# Patient Record
Sex: Female | Born: 1960
Health system: Southern US, Community
[De-identification: ages and names within clinical notes are randomized; demographics above are authoritative.]

## PROBLEM LIST (undated history)

## (undated) DIAGNOSIS — I10 Essential (primary) hypertension: Secondary | ICD-10-CM

## (undated) DIAGNOSIS — M51369 Other intervertebral disc degeneration, lumbar region without mention of lumbar back pain or lower extremity pain: Secondary | ICD-10-CM

## (undated) DIAGNOSIS — E039 Hypothyroidism, unspecified: Secondary | ICD-10-CM

## (undated) DIAGNOSIS — M5136 Other intervertebral disc degeneration, lumbar region: Secondary | ICD-10-CM

## (undated) HISTORY — DX: Hypothyroidism, unspecified: E03.9

## (undated) HISTORY — PX: WISDOM TOOTH EXTRACTION: SHX21

## (undated) HISTORY — DX: Other intervertebral disc degeneration, lumbar region: M51.36

## (undated) HISTORY — DX: Other intervertebral disc degeneration, lumbar region without mention of lumbar back pain or lower extremity pain: M51.369

## (undated) HISTORY — PX: COLONOSCOPY: SHX174

## (undated) HISTORY — DX: Essential (primary) hypertension: I10

---

## 2002-11-22 ENCOUNTER — Encounter: Payer: Self-pay | Admitting: Internal Medicine

## 2002-11-22 ENCOUNTER — Ambulatory Visit (HOSPITAL_COMMUNITY): Admission: RE | Admit: 2002-11-22 | Discharge: 2002-11-22 | Payer: Self-pay | Admitting: Internal Medicine

## 2003-12-05 ENCOUNTER — Ambulatory Visit (HOSPITAL_COMMUNITY): Admission: RE | Admit: 2003-12-05 | Discharge: 2003-12-05 | Payer: Self-pay | Admitting: Internal Medicine

## 2005-11-15 ENCOUNTER — Ambulatory Visit (HOSPITAL_COMMUNITY): Admission: RE | Admit: 2005-11-15 | Discharge: 2005-11-15 | Payer: Self-pay | Admitting: Internal Medicine

## 2006-08-20 ENCOUNTER — Ambulatory Visit (HOSPITAL_COMMUNITY): Admission: RE | Admit: 2006-08-20 | Discharge: 2006-08-20 | Payer: Self-pay | Admitting: Internal Medicine

## 2007-03-11 ENCOUNTER — Other Ambulatory Visit: Admission: RE | Admit: 2007-03-11 | Discharge: 2007-03-11 | Payer: Self-pay | Admitting: Unknown Physician Specialty

## 2007-09-10 HISTORY — PX: BIOPSY THYROID: PRO38

## 2010-07-16 ENCOUNTER — Ambulatory Visit (HOSPITAL_COMMUNITY): Admission: RE | Admit: 2010-07-16 | Discharge: 2010-07-16 | Payer: Self-pay | Admitting: Internal Medicine

## 2010-09-07 ENCOUNTER — Encounter
Admission: RE | Admit: 2010-09-07 | Discharge: 2010-09-07 | Payer: Self-pay | Source: Home / Self Care | Attending: Internal Medicine | Admitting: Internal Medicine

## 2013-08-03 ENCOUNTER — Other Ambulatory Visit: Payer: Self-pay | Admitting: Orthopaedic Surgery

## 2013-08-03 DIAGNOSIS — M545 Low back pain, unspecified: Secondary | ICD-10-CM

## 2013-08-06 ENCOUNTER — Ambulatory Visit
Admission: RE | Admit: 2013-08-06 | Discharge: 2013-08-06 | Disposition: A | Discharge status: Home / Self Care | Payer: 59 | Source: Ambulatory Visit | Attending: Orthopaedic Surgery | Admitting: Orthopaedic Surgery

## 2013-08-06 DIAGNOSIS — M545 Low back pain, unspecified: Secondary | ICD-10-CM

## 2013-08-09 ENCOUNTER — Ambulatory Visit (INDEPENDENT_AMBULATORY_CARE_PROVIDER_SITE_OTHER): Payer: 59 | Admitting: Internal Medicine

## 2013-08-09 ENCOUNTER — Encounter: Payer: Self-pay | Admitting: Internal Medicine

## 2013-08-09 VITALS — BP 118/76 | HR 60 | Temp 97.7°F | Resp 18 | Ht 68.5 in | Wt 157.0 lb

## 2013-08-09 DIAGNOSIS — M545 Low back pain, unspecified: Secondary | ICD-10-CM

## 2013-08-09 DIAGNOSIS — Z111 Encounter for screening for respiratory tuberculosis: Secondary | ICD-10-CM

## 2013-08-09 DIAGNOSIS — Z1212 Encounter for screening for malignant neoplasm of rectum: Secondary | ICD-10-CM

## 2013-08-09 DIAGNOSIS — E559 Vitamin D deficiency, unspecified: Secondary | ICD-10-CM | POA: Insufficient documentation

## 2013-08-09 DIAGNOSIS — R7401 Elevation of levels of liver transaminase levels: Secondary | ICD-10-CM

## 2013-08-09 DIAGNOSIS — R030 Elevated blood-pressure reading, without diagnosis of hypertension: Secondary | ICD-10-CM

## 2013-08-09 DIAGNOSIS — I10 Essential (primary) hypertension: Secondary | ICD-10-CM

## 2013-08-09 DIAGNOSIS — E039 Hypothyroidism, unspecified: Secondary | ICD-10-CM | POA: Insufficient documentation

## 2013-08-09 DIAGNOSIS — Z0000 Encounter for general adult medical examination without abnormal findings: Secondary | ICD-10-CM

## 2013-08-09 DIAGNOSIS — Z113 Encounter for screening for infections with a predominantly sexual mode of transmission: Secondary | ICD-10-CM

## 2013-08-09 LAB — CBC WITH DIFFERENTIAL/PLATELET
Basophils Absolute: 0 10*3/uL (ref 0.0–0.1)
Basophils Relative: 1 % (ref 0–1)
Eosinophils Absolute: 0.2 10*3/uL (ref 0.0–0.7)
Eosinophils Relative: 2 % (ref 0–5)
Lymphocytes Relative: 34 % (ref 12–46)
Lymphs Abs: 2.2 10*3/uL (ref 0.7–4.0)
MCHC: 34 g/dL (ref 30.0–36.0)
MCV: 87.4 fL (ref 78.0–100.0)
Monocytes Absolute: 0.5 10*3/uL (ref 0.1–1.0)
Neutrophils Relative %: 55 % (ref 43–77)
Platelets: 292 10*3/uL (ref 150–400)
RBC: 4.28 MIL/uL (ref 3.87–5.11)
RDW: 13.9 % (ref 11.5–15.5)
WBC: 6.6 10*3/uL (ref 4.0–10.5)

## 2013-08-09 LAB — BASIC METABOLIC PANEL WITH GFR
BUN: 9 mg/dL (ref 6–23)
CO2: 28 mEq/L (ref 19–32)
Chloride: 104 mEq/L (ref 96–112)
GFR, Est Non African American: >89 mL/min
Potassium: 4.3 mEq/L (ref 3.5–5.3)
Sodium: 141 mEq/L (ref 135–145)

## 2013-08-09 LAB — HEPATIC FUNCTION PANEL
ALT: 17 U/L (ref 0–35)
Albumin: 4.7 g/dL (ref 3.5–5.2)
Indirect Bilirubin: 0.6 mg/dL (ref 0.0–0.9)
Total Bilirubin: 0.8 mg/dL (ref 0.3–1.2)
Total Protein: 7.1 g/dL (ref 6.0–8.3)

## 2013-08-09 LAB — HEMOGLOBIN A1C: Hgb A1c MFr Bld: 5.5 % (ref <5.7)

## 2013-08-09 LAB — LIPID PANEL
Cholesterol: 199 mg/dL (ref 0–200)
HDL: 62 mg/dL (ref >39)
Triglycerides: 104 mg/dL (ref <150)

## 2013-08-09 LAB — MAGNESIUM: Magnesium: 2 mg/dL (ref 1.5–2.5)

## 2013-08-09 MED ORDER — GABAPENTIN 100 MG PO CAPS: ORAL_CAPSULE | ORAL | Status: DC

## 2013-08-09 NOTE — Patient Instructions (Signed)
   Continue diet & medications same as discussed.   Further disposition pending lab results.      Hypothyroidism The thyroid is a large gland located in the lower front of your neck. The thyroid gland helps control metabolism. Metabolism is how your body handles food. It controls metabolism with the hormone thyroxine. When this gland is underactive (hypothyroid), it produces too little hormone.  CAUSES These include:   Absence or destruction of thyroid tissue.  Goiter due to iodine deficiency.  Goiter due to medications.  Congenital defects (since birth).  Problems with the pituitary. This causes a lack of TSH (thyroid stimulating hormone). This hormone tells the thyroid to turn out more hormone. SYMPTOMS  Lethargy (feeling as though you have no energy)  Cold intolerance  Weight gain (in spite of normal food intake)  Dry skin  Coarse hair  Menstrual irregularity (if severe, may lead to infertility)  Slowing of thought processes Cardiac problems are also caused by insufficient amounts of thyroid hormone. Hypothyroidism in the newborn is cretinism, and is an extreme form. It is important that this form be treated adequately and immediately or it will lead rapidly to retarded physical and mental development. DIAGNOSIS  To prove hypothyroidism, your caregiver may do blood tests and ultrasound tests. Sometimes the signs are hidden. It may be necessary for your caregiver to watch this illness with blood tests either before or after diagnosis and treatment. TREATMENT  Low levels of thyroid hormone are increased by using synthetic thyroid hormone. This is a safe, effective treatment. It usually takes about four weeks to gain the full effects of the medication. After you have the full effect of the medication, it will generally take another four weeks for problems to leave. Your caregiver may start you on low doses. If you have had heart problems the dose may be gradually increased. It  is generally not an emergency to get rapidly to normal. HOME CARE INSTRUCTIONS   Take your medications as your caregiver suggests. Let your caregiver know of any medications you are taking or start taking. Your caregiver will help you with dosage schedules.  As your condition improves, your dosage needs may increase. It will be necessary to have continuing blood tests as suggested by your caregiver.  Report all suspected medication side effects to your caregiver. SEEK MEDICAL CARE IF: Seek medical care if you develop:  Sweating.  Tremulousness (tremors).  Anxiety.  Rapid weight loss.  Heat intolerance.  Emotional swings.  Diarrhea.  Weakness. SEEK IMMEDIATE MEDICAL CARE IF:  You develop chest pain, an irregular heart beat (palpitations), or a rapid heart beat. MAKE SURE YOU:   Understand these instructions.  Will watch your condition.  Will get help right away if you are not doing well or get worse. Document Released: 08/26/2005 Document Revised: 11/18/2011 Document Reviewed: 04/15/2008 [facility] Patient Information 2014 [place], [place].

## 2013-08-09 NOTE — Progress Notes (Signed)
Patient ID: Jodi Graham, female   DOB: November 06, 1960, 52 y.o.   MRN: [id number]   Annual Screening Comprehensive Examination   This very nice    presents for complete physical.  Patient has no major health issues. She does have hx/o slightly elevated BP's in 2009 and 2011 and has been followed expectantly . Today's BP is 118/76 and she denies any hypertensive or cardiac type symptoms.   Patient has hypothyroidism predating from 2009 and has been on replacement therapy.    Also, patient has LBP about 1 year and 4 days ago had Lumbar MRI per Dr Yisroel Ramming who told her she had DDD by plain xrays. She states the pain is worst with lying/sitting and least with standing. She has had no sciatica. Pain severity ranges 2-3/10 up to 7/10 at it's worst. She denies sciatica.    Lastly, patient has history of Vitamin D Deficiency with last vitamin D 44 in Nov 2013.She also has high cholesterol 254 and LDL 160 and elevated A1c 5.7% at her last OV in Nov 2013.      Meds: L-thyroxine 50 mcg tab qd, Vit 1000 u qd   Allergies  Allergen Reactions  . Codeine     Past Medical History  Diagnosis Date  . Hypertension, labile    Vitamin D Deficiency    Cervical DDD    Lumbar DDD   . Hypothyroidism    PSHx: 2008 Thyroid Bx Lymphocytic Thyroiditis                      TAb   Family History  Problem Relation Age of Onset  . Lymphoma Father     History   Social History  . Marital Status: Married    Spouse Name: N/A    Number of Children: N/A  . Years of Education: N/A   Occupational History  . Not on file.   Social History Main Topics  . Smoking status: Never Smoker   . Smokeless tobacco: Not on file  . Alcohol Use: 1.0 oz/week    2 drink(s) per week  . Drug Use: No  . Sexual Activity: Not on file   Other Topics Concern  . Not on file   Social History Narrative  . M x 17 yrs - H 57 in [facility]    ROS Constitutional: Denies fever, chills, weight loss/gain, headaches, insomnia, fatigue, night  sweats, and change in appetite. Eyes: Denies redness, blurred vision, diplopia, discharge, itchy, watery eyes.  ENT: Denies discharge, congestion, post nasal drip, epistaxis, sore throat, earache, hearing loss, dental pain, Tinnitus, Vertigo, Sinus pain, snoring.  Cardio: Denies chest pain, palpitations, irregular heartbeat, syncope, dyspnea, diaphoresis, orthopnea, PND, claudication, edema Respiratory: denies cough, dyspnea, DOE, pleurisy, hoarseness, laryngitis, wheezing.  Gastrointestinal: Denies dysphagia, heartburn, reflux, water brash, pain, cramps, nausea, vomiting, bloating, diarrhea, constipation, hematemesis, melena, hematochezia, jaundice, hemorrhoids Genitourinary: Denies dysuria, frequency, urgency, nocturia, hesitancy, discharge, hematuria, flank pain Musculoskeletal: Denies arthralgia, myalgia, stiffness, Jt. Swelling, pain, limp, and strain/sprain. Skin: Denies puritis, rash, hives, warts, acne, eczema, changing in skin lesion Neuro: Weakness, tremor, incoordination, spasms, paresthesia, pain Psychiatric: Denies confusion, memory loss, sensory loss. Endocrine: Denies change in weight, skin, hair change, nocturia, and paresthesia, diabetic polys, visual blurring, hyper /hypo glycemic episodes.  Heme/Lymph: No excessive bleeding, bruising, enlarged lymph nodes.  Filed Vitals:   08/09/13 1052  BP: 118/76  Pulse: 60  Temp: 97.7 F (36.5 C)  Resp: 18   Estimated body mass index is 23.52 kg/(m^2) as  calculated from the following:   Height as of this encounter: 5' 8.5" (1.74 m).   Weight as of this encounter: 157 lb (71.215 kg).  Physical Exam General Appearance: Well nourished, in no apparent distress. Eyes: PERRLA, EOMs, conjunctiva no swelling or erythema, normal fundi and vessels. Sinuses: No frontal/maxillary tenderness ENT/Mouth: EACs patent / TMs  nl. Nares clear without erythema, swelling, mucoid exudates. Oral hygiene is good. No erythema, swelling, or exudate. Tongue  normal, non-obstructing. Tonsils not swollen or erythematous. Hearing normal.  Neck: Supple, thyroid normal. No bruits, nodes or JVD. Respiratory: Respiratory effort normal.  BS equal and clear bilateral without rales, rhonci, wheezing or stridor. Cardio: Heart sounds are normal with regular rate and rhythm and no murmurs, rubs or gallops. Peripheral pulses are normal and equal bilaterally without edema. No aortic or femoral bruits. Chest: symmetric with normal excursions and percussion. Breasts: Deferred to Gyn Abdomen: Flat, soft, with bowl sounds. Nontender, no guarding, rebound, hernias, masses, or organomegaly.  Lymphatics: Non tender without lymphadenopathy.  Musculoskeletal: Full ROM all peripheral extremities, joint stability, 5/5 strength, and normal gait. Skin: Warm and dry without rashes, lesions, cyanosis, clubbing or  ecchymosis.  Neuro: Cranial nerves intact, reflexes equal bilaterally. Normal muscle tone, no cerebellar symptoms. Sensation intact.  Pysch: Awake and oriented X 3, normal affect, Insight and Judgment appropriate.   Assessment and Plan  1. Annual Screening Examination 2. Hypothyroidism 3.  Vitamin D Deficiency 4.  DDD, CX & Lumbar - try gabaprntin for pain pending MRI results  Continue prudent diet as discussed, weight control, regular exercise, and medications. Routine screening labs and tests as requested with regular follow-up as recommended.

## 2013-08-10 LAB — HEPATITIS C ANTIBODY: HCV Ab: NEGATIVE

## 2013-08-10 LAB — HEPATITIS B SURFACE ANTIBODY,QUALITATIVE: Hep B S Ab: NEGATIVE

## 2013-08-10 LAB — URINALYSIS, MICROSCOPIC ONLY

## 2013-08-10 LAB — HEPATITIS B CORE ANTIBODY, TOTAL: Hep B Core Total Ab: NONREACTIVE

## 2013-08-10 LAB — HIV ANTIBODY (ROUTINE TESTING W REFLEX): HIV: NONREACTIVE

## 2013-08-10 LAB — INSULIN, FASTING: Insulin fasting, serum: 5 u[IU]/mL (ref 3–28)

## 2013-08-10 LAB — MICROALBUMIN / CREATININE URINE RATIO
Creatinine, Urine: 34.5 mg/dL
Microalb, Ur: 0.5 mg/dL (ref 0.00–1.89)

## 2013-08-10 LAB — HEPATITIS A ANTIBODY, TOTAL: Hep A Total Ab: NONREACTIVE

## 2013-08-10 LAB — VITAMIN D 25 HYDROXY (VIT D DEFICIENCY, FRACTURES): Vit D, 25-Hydroxy: 72 ng/mL (ref 30–89)

## 2013-08-11 ENCOUNTER — Telehealth: Payer: Self-pay | Admitting: *Deleted

## 2013-08-11 LAB — TB SKIN TEST: Induration: 0 mm

## 2013-08-17 NOTE — Telephone Encounter (Signed)
DONE

## 2013-12-13 ENCOUNTER — Other Ambulatory Visit: Payer: Self-pay | Admitting: Internal Medicine

## 2014-06-22 ENCOUNTER — Other Ambulatory Visit: Payer: Self-pay | Admitting: Physician Assistant

## 2014-07-31 ENCOUNTER — Encounter: Payer: Self-pay | Admitting: *Deleted

## 2014-08-11 ENCOUNTER — Encounter: Payer: Self-pay | Admitting: Internal Medicine

## 2014-08-15 ENCOUNTER — Encounter: Payer: Self-pay | Admitting: Internal Medicine

## 2014-08-17 ENCOUNTER — Encounter: Payer: Self-pay | Admitting: Internal Medicine

## 2014-08-17 ENCOUNTER — Ambulatory Visit (INDEPENDENT_AMBULATORY_CARE_PROVIDER_SITE_OTHER): Payer: 59 | Admitting: Internal Medicine

## 2014-08-17 VITALS — BP 110/68 | HR 68 | Temp 98.1°F | Resp 16 | Ht 68.25 in | Wt 155.6 lb

## 2014-08-17 DIAGNOSIS — Z0001 Encounter for general adult medical examination with abnormal findings: Secondary | ICD-10-CM

## 2014-08-17 DIAGNOSIS — E782 Mixed hyperlipidemia: Secondary | ICD-10-CM | POA: Insufficient documentation

## 2014-08-17 DIAGNOSIS — Z79899 Other long term (current) drug therapy: Secondary | ICD-10-CM | POA: Insufficient documentation

## 2014-08-17 DIAGNOSIS — E559 Vitamin D deficiency, unspecified: Secondary | ICD-10-CM

## 2014-08-17 DIAGNOSIS — I10 Essential (primary) hypertension: Secondary | ICD-10-CM | POA: Insufficient documentation

## 2014-08-17 DIAGNOSIS — IMO0001 Reserved for inherently not codable concepts without codable children: Secondary | ICD-10-CM

## 2014-08-17 DIAGNOSIS — R6889 Other general symptoms and signs: Secondary | ICD-10-CM

## 2014-08-17 DIAGNOSIS — Z1212 Encounter for screening for malignant neoplasm of rectum: Secondary | ICD-10-CM

## 2014-08-17 DIAGNOSIS — R7303 Prediabetes: Secondary | ICD-10-CM

## 2014-08-17 DIAGNOSIS — R030 Elevated blood-pressure reading, without diagnosis of hypertension: Secondary | ICD-10-CM

## 2014-08-17 DIAGNOSIS — Z87898 Personal history of other specified conditions: Secondary | ICD-10-CM | POA: Insufficient documentation

## 2014-08-17 DIAGNOSIS — E039 Hypothyroidism, unspecified: Secondary | ICD-10-CM

## 2014-08-17 LAB — CBC WITH DIFFERENTIAL/PLATELET
BASOS PCT: 1 % (ref 0–1)
Basophils Absolute: 0.1 10*3/uL (ref 0.0–0.1)
EOS ABS: 0.2 10*3/uL (ref 0.0–0.7)
EOS PCT: 3 % (ref 0–5)
HCT: 40.1 % (ref 36.0–46.0)
HEMOGLOBIN: 13.5 g/dL (ref 12.0–15.0)
LYMPHS ABS: 2 10*3/uL (ref 0.7–4.0)
Lymphocytes Relative: 30 % (ref 12–46)
MCH: 29.9 pg (ref 26.0–34.0)
MCHC: 33.7 g/dL (ref 30.0–36.0)
MCV: 88.7 fL (ref 78.0–100.0)
MPV: 9.6 fL (ref 9.4–12.4)
Monocytes Absolute: 0.6 10*3/uL (ref 0.1–1.0)
Monocytes Relative: 9 % (ref 3–12)
NEUTROS PCT: 57 % (ref 43–77)
Neutro Abs: 3.9 10*3/uL (ref 1.7–7.7)
Platelets: 300 10*3/uL (ref 150–400)
RBC: 4.52 MIL/uL (ref 3.87–5.11)
RDW: 13.7 % (ref 11.5–15.5)
WBC: 6.8 10*3/uL (ref 4.0–10.5)

## 2014-08-17 NOTE — Patient Instructions (Signed)
Recommend the book "The END of DIETING" by Dr Baker Janus   and the book "The END of DIABETES " by Dr Excell Seltzer  At [email or web address] - get book & Audio CD's      Being diabetic has a  300% increased risk for heart attack, stroke, cancer, and alzheimer- type vascular dementia. It is very important that you work harder with diet by avoiding all foods that are white except chicken & fish. Avoid white rice (brown & wild rice is OK), white potatoes (sweetpotatoes in moderation is OK), White bread or wheat bread or anything made out of white flour like bagels, donuts, rolls, buns, biscuits, cakes, pastries, cookies, pizza crust, and pasta (made from white flour & egg whites) - vegetarian pasta or spinach or wheat pasta is OK. Multigrain breads like Arnold's or [employer], or multigrain sandwich thins or flatbreads.  Diet, exercise and weight loss can reverse and cure diabetes in the early stages.  Diet, exercise and weight loss is very important in the control and prevention of complications of diabetes which affects every system in your body, ie. Brain - dementia/stroke, eyes - glaucoma/blindness, heart - heart attack/heart failure, kidneys - dialysis, stomach - gastric paralysis, intestines - malabsorption, nerves - severe painful neuritis, circulation - gangrene & loss of a leg(s), and finally cancer and Alzheimers.    I recommend avoid fried & greasy foods,  sweets/candy, white rice (brown or wild rice or Quinoa is OK), white potatoes (sweet potatoes are OK) - anything made from white flour - bagels, doughnuts, rolls, buns, biscuits,white and wheat breads, pizza crust and traditional pasta made of white flour & egg white(vegetarian pasta or spinach or wheat pasta is OK).  Multi-grain bread is OK - like multi-grain flat bread or sandwich thins. Avoid alcohol in excess. Exercise is also important.    Eat all the vegetables you want - avoid meat, especially red meat and dairy - especially cheese.  Cheese  is the most concentrated form of trans-fats which is the worst thing to clog up our arteries. Veggie cheese is OK which can be found in the fresh produce section at [employer] or [employer] or [employer]  Preventive Care for Adults A healthy lifestyle and preventive care can promote health and wellness. Preventive health guidelines for women include the following key practices.  A routine yearly physical is a good way to check with your health care provider about your health and preventive screening. It is a chance to share any concerns and updates on your health and to receive a thorough exam.  Visit your dentist for a routine exam and preventive care every 6 months. Brush your teeth twice a day and floss once a day. Good oral hygiene prevents tooth decay and gum disease.  The frequency of eye exams is based on your age, health, family medical history, use of contact lenses, and other factors. Follow your health care provider's recommendations for frequency of eye exams.  Eat a healthy diet. Foods like vegetables, fruits, whole grains, low-fat dairy products, and lean protein foods contain the nutrients you need without too many calories. Decrease your intake of foods high in solid fats, added sugars, and salt. Eat the right amount of calories for you.Get information about a proper diet from your health care provider, if necessary.  Regular physical exercise is one of the most important things you can do for your health. Most adults should get at least 150 minutes of moderate-intensity exercise (any activity that increases  your heart rate and causes you to sweat) each week. In addition, most adults need muscle-strengthening exercises on 2 or more days a week.  Maintain a healthy weight. The body mass index (BMI) is a screening tool to identify possible weight problems. It provides an estimate of body fat based on height and weight. Your health care provider can find your BMI and can help you  achieve or maintain a healthy weight.For adults 20 years and older:  A BMI below 18.5 is considered underweight.  A BMI of 18.5 to 24.9 is normal.  A BMI of 25 to 29.9 is considered overweight.  A BMI of 30 and above is considered obese.  Maintain normal blood lipids and cholesterol levels by exercising and minimizing your intake of saturated fat. Eat a balanced diet with plenty of fruit and vegetables. Blood tests for lipids and cholesterol should begin at age 38 and be repeated every 5 years. If your lipid or cholesterol levels are high, you are over 50, or you are at high risk for heart disease, you may need your cholesterol levels checked more frequently.Ongoing high lipid and cholesterol levels should be treated with medicines if diet and exercise are not working.  If you smoke, find out from your health care provider how to quit. If you do not use tobacco, do not start.  Lung cancer screening is recommended for adults aged 64-80 years who are at high risk for developing lung cancer because of a history of smoking. A yearly low-dose CT scan of the lungs is recommended for people who have at least a 30-pack-year history of smoking and are a current smoker or have quit within the past 15 years. A pack year of smoking is smoking an average of 1 pack of cigarettes a day for 1 year (for example: 1 pack a day for 30 years or 2 packs a day for 15 years). Yearly screening should continue until the smoker has stopped smoking for at least 15 years. Yearly screening should be stopped for people who develop a health problem that would prevent them from having lung cancer treatment.  High blood pressure causes heart disease and increases the risk of stroke. Your blood pressure should be checked at least every 1 to 2 years. Ongoing high blood pressure should be treated with medicines if weight loss and exercise do not work.  If you are 79-57 years old, ask your health care provider if you should take  aspirin to prevent strokes.  Diabetes screening involves taking a blood sample to check your fasting blood sugar level. This should be done once every 3 years, after age 37, if you are within normal weight and without risk factors for diabetes. Testing should be considered at a younger age or be carried out more frequently if you are overweight and have at least 1 risk factor for diabetes.  Breast cancer screening is essential preventive care for women. You should practice "breast self-awareness." This means understanding the normal appearance and feel of your breasts and may include breast self-examination. Any changes detected, no matter how small, should be reported to a health care provider. Women in their 76s and 30s should have a clinical breast exam (CBE) by a health care provider as part of a regular health exam every 1 to 3 years. After age 55, women should have a CBE every year. Starting at age 79, women should consider having a mammogram (breast X-ray test) every year. Women who have a family history of breast  cancer should talk to their health care provider about genetic screening. Women at a high risk of breast cancer should talk to their health care providers about having an MRI and a mammogram every year.  Breast cancer gene (BRCA)-related cancer risk assessment is recommended for women who have family members with BRCA-related cancers. BRCA-related cancers include breast, ovarian, tubal, and peritoneal cancers. Having family members with these cancers may be associated with an increased risk for harmful changes (mutations) in the breast cancer genes BRCA1 and BRCA2. Results of the assessment will determine the need for genetic counseling and BRCA1 and BRCA2 testing.  Routine pelvic exams to screen for cancer are no longer recommended for nonpregnant women who are considered low risk for cancer of the pelvic organs (ovaries, uterus, and vagina) and who do not have symptoms. Ask your health  care provider if a screening pelvic exam is right for you.  If you have had past treatment for cervical cancer or a condition that could lead to cancer, you need Pap tests and screening for cancer for at least 20 years after your treatment. If Pap tests have been discontinued, your risk factors (such as having a new sexual partner) need to be reassessed to determine if screening should be resumed. Some women have medical problems that increase the chance of getting cervical cancer. In these cases, your health care provider may recommend more frequent screening and Pap tests.  Colorectal cancer can be detected and often prevented. Most routine colorectal cancer screening begins at the age of 58 years and continues through age 44 years. However, your health care provider may recommend screening at an earlier age if you have risk factors for colon cancer. On a yearly basis, your health care provider may provide home test kits to check for hidden blood in the stool. Use of a small camera at the end of a tube, to directly examine the colon (sigmoidoscopy or colonoscopy), can detect the earliest forms of colorectal cancer. Talk to your health care provider about this at age 84, when routine screening begins. Direct exam of the colon should be repeated every 5-10 years through age 33 years, unless early forms of pre-cancerous polyps or small growths are found.  Hepatitis C blood testing is recommended for all people born from [birth year] through 1965 and any individual with known risks for hepatitis C.  Pra  Osteoporosis is a disease in which the bones lose minerals and strength with aging. This can result in serious bone fractures or breaks. The risk of osteoporosis can be identified using a bone density scan. Women ages 36 years and over and women at risk for fractures or osteoporosis should discuss screening with their health care providers. Ask your health care provider whether you should take a calcium supplement  or vitamin D to reduce the rate of osteoporosis.  Menopause can be associated with physical symptoms and risks. Hormone replacement therapy is available to decrease symptoms and risks. You should talk to your health care provider about whether hormone replacement therapy is right for you.  Use sunscreen. Apply sunscreen liberally and repeatedly throughout the day. You should seek shade when your shadow is shorter than you. Protect yourself by wearing long sleeves, pants, a wide-brimmed hat, and sunglasses year round, whenever you are outdoors.  Once a month, do a whole body skin exam, using a mirror to look at the skin on your back. Tell your health care provider of new moles, moles that have irregular borders, moles that are  larger than a pencil eraser, or moles that have changed in shape or color.  Stay current with required vaccines (immunizations).  Influenza vaccine. All adults should be immunized every year.  Tetanus, diphtheria, and acellular pertussis (Td, Tdap) vaccine. Pregnant women should receive 1 dose of Tdap vaccine during each pregnancy. The dose should be obtained regardless of the length of time since the last dose. Immunization is preferred during the 27th-36th week of gestation. An adult who has not previously received Tdap or who does not know her vaccine status should receive 1 dose of Tdap. This initial dose should be followed by tetanus and diphtheria toxoids (Td) booster doses every 10 years. Adults with an unknown or incomplete history of completing a 3-dose immunization series with Td-containing vaccines should begin or complete a primary immunization series including a Tdap dose. Adults should receive a Td booster every 10 years.  Varicella vaccine. An adult without evidence of immunity to varicella should receive 2 doses or a second dose if she has previously received 1 dose. Pregnant females who do not have evidence of immunity should receive the first dose after  pregnancy. This first dose should be obtained before leaving the health care facility. The second dose should be obtained 4-8 weeks after the first dose.  Human papillomavirus (HPV) vaccine. Females aged 13-26 years who have not received the vaccine previously should obtain the 3-dose series. The vaccine is not recommended for use in pregnant females. However, pregnancy testing is not needed before receiving a dose. If a female is found to be pregnant after receiving a dose, no treatment is needed. In that case, the remaining doses should be delayed until after the pregnancy. Immunization is recommended for any person with an immunocompromised condition through the age of 53 years if she did not get any or all doses earlier. During the 3-dose series, the second dose should be obtained 4-8 weeks after the first dose. The third dose should be obtained 24 weeks after the first dose and 16 weeks after the second dose.  Zoster vaccine. One dose is recommended for adults aged 31 years or older unless certain conditions are present.  Measles, mumps, and rubella (MMR) vaccine. Adults born before [birth year] generally are considered immune to measles and mumps. Adults born in [birth year] or later should have 1 or more doses of MMR vaccine unless there is a contraindication to the vaccine or there is laboratory evidence of immunity to each of the three diseases. A routine second dose of MMR vaccine should be obtained at least 28 days after the first dose for students attending postsecondary schools, health care workers, or international travelers. People who received inactivated measles vaccine or an unknown type of measles vaccine during 1963-1967 should receive 2 doses of MMR vaccine. People who received inactivated mumps vaccine or an unknown type of mumps vaccine before 1979 and are at high risk for mumps infection should consider immunization with 2 doses of MMR vaccine. For females of childbearing age, rubella immunity should  be determined. If there is no evidence of immunity, females who are not pregnant should be vaccinated. If there is no evidence of immunity, females who are pregnant should delay immunization until after pregnancy. Unvaccinated health care workers born before [birth year] who lack laboratory evidence of measles, mumps, or rubella immunity or laboratory confirmation of disease should consider measles and mumps immunization with 2 doses of MMR vaccine or rubella immunization with 1 dose of MMR vaccine.  Pneumococcal 13-valent conjugate (PCV13) vaccine. When  indicated, a person who is uncertain of her immunization history and has no record of immunization should receive the PCV13 vaccine. An adult aged 73 years or older who has certain medical conditions and has not been previously immunized should receive 1 dose of PCV13 vaccine. This PCV13 should be followed with a dose of pneumococcal polysaccharide (PPSV23) vaccine. The PPSV23 vaccine dose should be obtained at least 8 weeks after the dose of PCV13 vaccine. An adult aged 81 years or older who has certain medical conditions and previously received 1 or more doses of PPSV23 vaccine should receive 1 dose of PCV13. The PCV13 vaccine dose should be obtained 1 or more years after the last PPSV23 vaccine dose.    Pneumococcal polysaccharide (PPSV23) vaccine. When PCV13 is also indicated, PCV13 should be obtained first. All adults aged 69 years and older should be immunized. An adult younger than age 35 years who has certain medical conditions should be immunized. Any person who resides in a nursing home or long-term care facility should be immunized. An adult smoker should be immunized. People with an immunocompromised condition and certain other conditions should receive both PCV13 and PPSV23 vaccines. People with human immunodeficiency virus (HIV) infection should be immunized as soon as possible after diagnosis. Immunization during chemotherapy or radiation therapy should  be avoided. Routine use of PPSV23 vaccine is not recommended for American Indians, [place]atives, or people younger than 65 years unless there are medical conditions that require PPSV23 vaccine. When indicated, people who have unknown immunization and have no record of immunization should receive PPSV23 vaccine. One-time revaccination 5 years after the first dose of PPSV23 is recommended for people aged 19-64 years who have chronic kidney failure, nephrotic syndrome, asplenia, or immunocompromised conditions. People who received 1-2 doses of PPSV23 before age 79 years should receive another dose of PPSV23 vaccine at age 73 years or later if at least 5 years have passed since the previous dose. Doses of PPSV23 are not needed for people immunized with PPSV23 at or after age 19 years.  Preventive Services / Frequency   Ages 25 to 65 years  Blood pressure check.  Lipid and cholesterol check.  Lung cancer screening. / Every year if you are aged 8-80 years and have a 30-pack-year history of smoking and currently smoke or have quit within the past 15 years. Yearly screening is stopped once you have quit smoking for at least 15 years or develop a health problem that would prevent you from having lung cancer treatment.  Clinical breast exam.** / Every year after age 28 years.  BRCA-related cancer risk assessment.** / For women who have family members with a BRCA-related cancer (breast, ovarian, tubal, or peritoneal cancers).  Mammogram.** / Every year beginning at age 57 years and continuing for as long as you are in good health. Consult with your health care provider.  Pap test.** / Every 3 years starting at age 57 years through age 25 or 13 years with a history of 3 consecutive normal Pap tests.  HPV screening.** / Every 3 years from ages 21 years through ages 60 to 48 years with a history of 3 consecutive normal Pap tests.  Fecal occult blood test (FOBT) of stool. / Every year beginning at age 77  years and continuing until age 57 years. You may not need to do this test if you get a colonoscopy every 10 years.  Flexible sigmoidoscopy or colonoscopy.** / Every 5 years for a flexible sigmoidoscopy or every 10 years  for a colonoscopy beginning at age 104 years and continuing until age 39 years.  Hepatitis C blood test.** / For all people born from [birth year] through 1964 and any individual with known risks for hepatitis C.  Skin self-exam. / Monthly.  Influenza vaccine. / Every year.  Tetanus, diphtheria, and acellular pertussis (Tdap/Td) vaccine.** / Consult your health care provider. Pregnant women should receive 1 dose of Tdap vaccine during each pregnancy. 1 dose of Td every 10 years.  Varicella vaccine.** / Consult your health care provider. Pregnant females who do not have evidence of immunity should receive the first dose after pregnancy.  Zoster vaccine.** / 1 dose for adults aged 47 years or older.  Pneumococcal 13-valent conjugate (PCV13) vaccine.** / Consult your health care provider.  Pneumococcal polysaccharide (PPSV23) vaccine.** / 1 to 2 doses if you smoke cigarettes or if you have certain conditions.  Meningococcal vaccine.** / Consult your health care provider.  Hepatitis A vaccine.** / Consult your health care provider.  Hepatitis B vaccine.** / Consult your health care provider. Screening for abdominal aortic aneurysm (AAA)  by ultrasound is recommended for people over 50 who have history of high blood pressure or who are current or former smokers.

## 2014-08-17 NOTE — Progress Notes (Signed)
Patient ID: Jodi Graham, female   DOB: Feb 05, 1961, 53 y.o.   MRN: [id number]  Annual Screening Comprehensive Examination  This very nice 53 y.o.female presents for complete physical.  Patient has been followed for labile HTN, Prediabetes, Hyperlipidemia, and Vitamin D Deficiency. Patient has been on thyroid replacement since Aug 2009.    Patient has hx/o elevated BP 133/92 in 2009 & 140/80 in 2011 and has been monitored expectantly. Patient's BP has been controlled at home and patient denies any cardiac symptoms as chest pain, palpitations, shortness of breath, dizziness or ankle swelling. Today's BP is  110/68 mmHg    In Nov 2013 , pt's Chol 254 and LDL was 160. Patient's hyperlipidemia is improved with diet. Last lipids were  TChpol 199, TG 104, HDL 62 and LDL 116 in Dec 2014.    Patient has  prediabetes predating since Nov 2013 with A1c 5.7% and patient denies reactive hypoglycemic symptoms, visual blurring, diabetic polys, or paresthesias. Last A1c was 5.5% in Dec 2014.   Finally, patient has history of Vitamin D Deficiency (43 in 2011) and last Vitamin D was 72 in Dec 2014.  Medication Sig  . VITAMIN D 1000 UNITS tab Take 1,000 Units  daily.  Marland Kitchen levothyroxine  125 MCG tab TAKE 1/2 TAB DAILY  (62.5 mcg)   . gabapentin 100 MG cap Take 1 or 2 tablets 2 or 3 x daily as needed for back pain   Allergies  Allergen Reactions  . Codeine    Past Medical History  Diagnosis Date  . Hypertension   . DDD (degenerative disc disease)   . Hypothyroidism    Health Maintenance  Topic Date Due  . PAP SMEAR  04/13/1980  . COLONOSCOPY  04/13/2012  . INFLUENZA VACCINE  04/09/2014  . TETANUS/TDAP  04/16/2015  . MAMMOGRAM  05/11/2016   Immunization History  Administered Date(s) Administered  . PPD Test 08/09/2013  . Pneumococcal Polysaccharide-23 07/29/2012  . Td 04/15/2005   No past surgical history on file. Family History  Problem Relation Age of Onset  . Lymphoma Father    History   Substance Use Topics  . Smoking status: Never Smoker   . Smokeless tobacco: Not on file  . Alcohol Use: 1.0 oz/week    2 drink(s) per week    ROS  Constitutional: Denies fever, chills, weight loss/gain, headaches, insomnia, fatigue, night sweats, and change in appetite. Eyes: Denies redness, blurred vision, diplopia, discharge, itchy, watery eyes.  ENT: Denies discharge, congestion, post nasal drip, epistaxis, sore throat, earache, hearing loss, dental pain, Tinnitus, Vertigo, Sinus pain, snoring.  Cardio: Denies chest pain, palpitations, irregular heartbeat, syncope, dyspnea, diaphoresis, orthopnea, PND, claudication, edema Respiratory: denies cough, dyspnea, DOE, pleurisy, hoarseness, laryngitis, wheezing.  Gastrointestinal: Denies dysphagia, heartburn, reflux, water brash, pain, cramps, nausea, vomiting, bloating, diarrhea, constipation, hematemesis, melena, hematochezia, jaundice, hemorrhoids Genitourinary: Denies dysuria, frequency, urgency, nocturia, hesitancy, discharge, hematuria, flank pain Breast: Breast lumps, nipple discharge, bleeding.  Musculoskeletal: Denies arthralgia, myalgia, stiffness, Jt. Swelling, pain, limp, and strain/sprain. Denies falls. Skin: Denies puritis, rash, hives, warts, acne, eczema, changing in skin lesion Neuro: No weakness, tremor, incoordination, spasms, paresthesia, pain Psychiatric: Denies confusion, memory loss, sensory loss. Denies Depression. Endocrine: Denies change in weight, skin, hair change, nocturia, and paresthesia, diabetic polys, visual blurring, hyper / hypo glycemic episodes.  Heme/Lymph: No excessive bleeding, bruising, enlarged lymph nodes.  Physical Exam  BP 110/68   Pulse 68  Temp 98.1 F  Resp 16  Ht 5' 8.25"   Wt 155  lb 9.6 oz   BMI 23.47  General Appearance: Well nourished and in no apparent distress. Eyes: PERRLA, EOMs, conjunctiva no swelling or erythema, normal fundi and vessels. Sinuses: No frontal/maxillary  tenderness ENT/Mouth: EACs patent / TMs  nl. Nares clear without erythema, swelling, mucoid exudates. Oral hygiene is good. No erythema, swelling, or exudate. Tongue normal, non-obstructing. Tonsils not swollen or erythematous. Hearing normal.  Neck: Supple, thyroid normal. No bruits, nodes or JVD. Respiratory: Respiratory effort normal.  BS equal and clear bilateral without rales, rhonci, wheezing or stridor. Cardio: Heart sounds are normal with regular rate and rhythm and no murmurs, rubs or gallops. Peripheral pulses are normal and equal bilaterally without edema. No aortic or femoral bruits. Chest: symmetric with normal excursions and percussion. Breasts: Symmetric, without lumps, nipple discharge, retractions, or fibrocystic changes.  Abdomen: Flat, soft, with bowl sounds. Nontender, no guarding, rebound, hernias, masses, or organomegaly.  Lymphatics: Non tender without lymphadenopathy.  Musculoskeletal: Full ROM all peripheral extremities, joint stability, 5/5 strength, and normal gait. Skin: Warm and dry without rashes, lesions, cyanosis, clubbing or  ecchymosis.  Neuro: Cranial nerves intact, reflexes equal bilaterally. Normal muscle tone, no cerebellar symptoms. Sensation intact.  Pysch: Awake and oriented X 3, normal affect, Insight and Judgment appropriate.   Assessment and Plan  1. Encounter for general adult medical examination with abnormal findings  - Urine Microscopic - Vitamin B12 - CBC with Differential - BASIC METABOLIC PANEL WITH GFR - Magnesium - Lipid panel - TSH - Hemoglobin A1c - Insulin, fasting  2. Hypothyroidism, unspecified hypothyroidism type   3. Vitamin D deficiency  - Vit D  25 hydroxy (rtn osteoporosis monitoring)  4. Elevated BP  - Microalbumin / creatinine urine ratio - EKG 12-Lead  5. Screening for rectal cancer  - POC Hemoccult Bld/Stl (3-Cd Home Screen); Future   Continue prudent diet as discussed, weight control, BP monitoring,  regular exercise, and medications. Discussed med's effects and SE's. Screening labs and tests as requested with regular follow-up as recommended.

## 2014-08-18 LAB — HEMOGLOBIN A1C
HEMOGLOBIN A1C: 5.5 % (ref <5.7)
Mean Plasma Glucose: 111 mg/dL (ref <117)

## 2014-08-18 LAB — BASIC METABOLIC PANEL WITH GFR
BUN: 13 mg/dL (ref 6–23)
CALCIUM: 10 mg/dL (ref 8.4–10.5)
CO2: 29 meq/L (ref 19–32)
CREATININE: 0.8 mg/dL (ref 0.50–1.10)
Chloride: 101 mEq/L (ref 96–112)
GFR, Est Non African American: 85 mL/min
GLUCOSE: 92 mg/dL (ref 70–99)
Potassium: 4.7 mEq/L (ref 3.5–5.3)
Sodium: 138 mEq/L (ref 135–145)

## 2014-08-18 LAB — LIPID PANEL
CHOL/HDL RATIO: 2.7 ratio
Cholesterol: 219 mg/dL — ABNORMAL HIGH (ref 0–200)
HDL: 82 mg/dL (ref >39)
LDL CALC: 121 mg/dL — AB (ref 0–99)
Triglycerides: 80 mg/dL (ref <150)
VLDL: 16 mg/dL (ref 0–40)

## 2014-08-18 LAB — MAGNESIUM: Magnesium: 2.1 mg/dL (ref 1.5–2.5)

## 2014-08-18 LAB — URINALYSIS, MICROSCOPIC ONLY
BACTERIA UA: NONE SEEN
CRYSTALS: NONE SEEN
Casts: NONE SEEN
Squamous Epithelial / LPF: NONE SEEN

## 2014-08-18 LAB — MICROALBUMIN / CREATININE URINE RATIO: Creatinine, Urine: 10.3 mg/dL

## 2014-08-18 LAB — TSH: TSH: 1.448 u[IU]/mL (ref 0.350–4.500)

## 2014-08-18 LAB — VITAMIN B12: VITAMIN B 12: 571 pg/mL (ref 211–911)

## 2014-08-18 LAB — INSULIN, FASTING: Insulin fasting, serum: 6.3 u[IU]/mL (ref 2.0–19.6)

## 2014-08-18 LAB — VITAMIN D 25 HYDROXY (VIT D DEFICIENCY, FRACTURES): Vit D, 25-Hydroxy: 37 ng/mL (ref 30–100)

## 2014-09-21 ENCOUNTER — Other Ambulatory Visit: Payer: Self-pay | Admitting: Physician Assistant

## 2015-04-10 ENCOUNTER — Other Ambulatory Visit: Payer: Self-pay | Admitting: Internal Medicine

## 2015-07-30 ENCOUNTER — Encounter: Payer: Self-pay | Admitting: *Deleted

## 2015-08-29 ENCOUNTER — Encounter: Payer: Self-pay | Admitting: Internal Medicine

## 2015-09-18 ENCOUNTER — Encounter: Payer: Self-pay | Admitting: Internal Medicine

## 2015-09-18 ENCOUNTER — Ambulatory Visit (INDEPENDENT_AMBULATORY_CARE_PROVIDER_SITE_OTHER): Payer: 59 | Admitting: Internal Medicine

## 2015-09-18 VITALS — BP 116/78 | HR 64 | Temp 97.3°F | Resp 16 | Ht 68.5 in | Wt 167.4 lb

## 2015-09-18 DIAGNOSIS — Z23 Encounter for immunization: Secondary | ICD-10-CM | POA: Diagnosis not present

## 2015-09-18 DIAGNOSIS — Z0000 Encounter for general adult medical examination without abnormal findings: Secondary | ICD-10-CM

## 2015-09-18 DIAGNOSIS — E039 Hypothyroidism, unspecified: Secondary | ICD-10-CM

## 2015-09-18 DIAGNOSIS — Z111 Encounter for screening for respiratory tuberculosis: Secondary | ICD-10-CM | POA: Diagnosis not present

## 2015-09-18 DIAGNOSIS — Z0001 Encounter for general adult medical examination with abnormal findings: Secondary | ICD-10-CM

## 2015-09-18 DIAGNOSIS — E782 Mixed hyperlipidemia: Secondary | ICD-10-CM

## 2015-09-18 DIAGNOSIS — R5383 Other fatigue: Secondary | ICD-10-CM

## 2015-09-18 DIAGNOSIS — R7303 Prediabetes: Secondary | ICD-10-CM

## 2015-09-18 DIAGNOSIS — R030 Elevated blood-pressure reading, without diagnosis of hypertension: Secondary | ICD-10-CM | POA: Diagnosis not present

## 2015-09-18 DIAGNOSIS — IMO0001 Reserved for inherently not codable concepts without codable children: Secondary | ICD-10-CM

## 2015-09-18 DIAGNOSIS — E559 Vitamin D deficiency, unspecified: Secondary | ICD-10-CM

## 2015-09-18 DIAGNOSIS — Z79899 Other long term (current) drug therapy: Secondary | ICD-10-CM

## 2015-09-18 DIAGNOSIS — Z1212 Encounter for screening for malignant neoplasm of rectum: Secondary | ICD-10-CM

## 2015-09-18 LAB — IRON AND TIBC
%SAT: 19 % (ref 11–50)
IRON: 62 ug/dL (ref 45–160)
TIBC: 335 ug/dL (ref 250–450)
UIBC: 273 ug/dL (ref 125–400)

## 2015-09-18 LAB — MAGNESIUM: MAGNESIUM: 2.1 mg/dL (ref 1.5–2.5)

## 2015-09-18 LAB — CBC WITH DIFFERENTIAL/PLATELET
BASOS PCT: 1 % (ref 0–1)
Basophils Absolute: 0.1 10*3/uL (ref 0.0–0.1)
EOS ABS: 0.2 10*3/uL (ref 0.0–0.7)
Eosinophils Relative: 3 % (ref 0–5)
HEMATOCRIT: 40.8 % (ref 36.0–46.0)
HEMOGLOBIN: 13.4 g/dL (ref 12.0–15.0)
Lymphocytes Relative: 32 % (ref 12–46)
Lymphs Abs: 2.5 10*3/uL (ref 0.7–4.0)
MCH: 29.8 pg (ref 26.0–34.0)
MCHC: 32.8 g/dL (ref 30.0–36.0)
MCV: 90.7 fL (ref 78.0–100.0)
MONO ABS: 0.6 10*3/uL (ref 0.1–1.0)
MONOS PCT: 8 % (ref 3–12)
MPV: 9.6 fL (ref 8.6–12.4)
NEUTROS PCT: 56 % (ref 43–77)
Neutro Abs: 4.3 10*3/uL (ref 1.7–7.7)
Platelets: 323 10*3/uL (ref 150–400)
RBC: 4.5 MIL/uL (ref 3.87–5.11)
RDW: 13 % (ref 11.5–15.5)
WBC: 7.7 10*3/uL (ref 4.0–10.5)

## 2015-09-18 LAB — BASIC METABOLIC PANEL WITH GFR
BUN: 12 mg/dL (ref 7–25)
CHLORIDE: 102 mmol/L (ref 98–110)
CO2: 31 mmol/L (ref 20–31)
CREATININE: 0.64 mg/dL (ref 0.50–1.05)
Calcium: 9.3 mg/dL (ref 8.6–10.4)
GFR, Est African American: >89 mL/min (ref >=60)
GFR, Est Non African American: >89 mL/min (ref >=60)
Glucose, Bld: 69 mg/dL (ref 65–99)
POTASSIUM: 4.2 mmol/L (ref 3.5–5.3)
Sodium: 140 mmol/L (ref 135–146)

## 2015-09-18 LAB — HEPATIC FUNCTION PANEL
ALBUMIN: 4.6 g/dL (ref 3.6–5.1)
ALK PHOS: 69 U/L (ref 33–130)
ALT: 21 U/L (ref 6–29)
AST: 20 U/L (ref 10–35)
BILIRUBIN TOTAL: 0.5 mg/dL (ref 0.2–1.2)
Bilirubin, Direct: 0.1 mg/dL (ref <=0.2)
Indirect Bilirubin: 0.4 mg/dL (ref 0.2–1.2)
Total Protein: 7.2 g/dL (ref 6.1–8.1)

## 2015-09-18 LAB — LIPID PANEL
CHOL/HDL RATIO: 2.9 ratio (ref <=5.0)
Cholesterol: 219 mg/dL — ABNORMAL HIGH (ref 125–200)
HDL: 75 mg/dL (ref >=46)
LDL CALC: 114 mg/dL (ref <130)
Triglycerides: 151 mg/dL — ABNORMAL HIGH (ref <150)
VLDL: 30 mg/dL (ref <30)

## 2015-09-18 LAB — VITAMIN B12: Vitamin B-12: 631 pg/mL (ref 211–911)

## 2015-09-18 LAB — HEMOGLOBIN A1C
HEMOGLOBIN A1C: 5.4 % (ref <5.7)
Mean Plasma Glucose: 108 mg/dL (ref <117)

## 2015-09-18 LAB — TSH: TSH: 0.752 u[IU]/mL (ref 0.350–4.500)

## 2015-09-18 NOTE — Progress Notes (Signed)
Patient ID: Jodi Graham, female   DOB: Mar 23, 1961, 54 y.o.   MRN: [id number]  Annual Screening/Preventative Visit And Comprehensive Evaluation &  Examination     This very nice 55 y.o. MWF presents for presents for a Wellness/Preventat, Hypothyroidism  and screened for Prediabetes.        Labile HTN predates since 2009 with a borderline BP of 133/92 & 140/80 in 2011. Patient's BP has been controlled at home and patient denies any cardiac symptoms as chest pain, palpitations, shortness of breath, dizziness or ankle swelling. Today's BP: 116/78 mmHg      Patient's hyperlipidemia is not controlled with diet.  Last lipids were not at goal with Elevated Total Chol 219, HDL 82, Trig 80 and elevated LDL 121 in Dec 2015.      Patient is screened proactively for Prediabetes with hx/o A1c 5.7% in Nov 2013 and patient denies reactive hypoglycemic symptoms, visual blurring, diabetic polys, or paresthesias. Last A1c was 5.5% in Dec 2015.     The patient has been on Thyroid replacement since 2009. Finally, patient has history of Vitamin D Deficiency of 43 on treatment in 2011 and last Vitamin D was still low at 70 in Dec 2015.  Medication Sig  . VITAMIN D 1000 UNITS tablet Take 1,000 Units by mouth daily.  Marland Kitchen gabapentin  100 MG capsule Take 1 or 2 tablets 2 or 3 x daily as needed for back pain  . levothyroxine  125 MCG tablet TAKE 1 TABLET BY MOUTH DAILY FOR THYROID   Allergies  Allergen Reactions  . Codeine    Past Medical History  Diagnosis Date  . DDD (degenerative disc disease)   . Hypertension 2009/2011    labile  . Hypothyroidism    Health Maintenance  Topic Date Due  . PAP SMEAR  04/14/1983  . COLONOSCOPY  04/13/2012  . INFLUENZA VACCINE  04/10/2015  . TETANUS/TDAP  04/16/2015  . MAMMOGRAM  05/24/2017  . Hepatitis C Screening  Completed  . HIV Screening  Completed   Immunization History  Administered Date(s) Administered  . PPD Test 08/09/2013, 09/18/2015  . Pneumococcal  Polysaccharide-23 07/29/2012  . Td 04/15/2005  . Tdap 09/18/2015   No past surgical history on file. Family History  Problem Relation Age of Onset  . Lymphoma Father    Social History  Substance Use Topics  . Smoking status: Never Smoker   . Smokeless tobacco: None  . Alcohol Use: 1.0 oz/week    2 drink(s) per week    ROS Constitutional: Denies fever, chills, weight loss/gain, headaches, insomnia,  night sweats, and change in appetite. Does c/o fatigue. Eyes: Denies redness, blurred vision, diplopia, discharge, itchy, watery eyes.  ENT: Denies discharge, congestion, post nasal drip, epistaxis, sore throat, earache, hearing loss, dental pain, Tinnitus, Vertigo, Sinus pain, snoring.  Cardio: Denies chest pain, palpitations, irregular heartbeat, syncope, dyspnea, diaphoresis, orthopnea, PND, claudication, edema Respiratory: denies cough, dyspnea, DOE, pleurisy, hoarseness, laryngitis, wheezing.  Gastrointestinal: Denies dysphagia, heartburn, reflux, water brash, pain, cramps, nausea, vomiting, bloating, diarrhea, constipation, hematemesis, melena, hematochezia, jaundice, hemorrhoids Genitourinary: Denies dysuria, frequency, urgency, nocturia, hesitancy, discharge, hematuria, flank pain Breast: Breast lumps, nipple discharge, bleeding.  Musculoskeletal: Denies arthralgia, myalgia, stiffness, Jt. Swelling, pain, limp, and strain/sprain. Denies falls. Skin: Denies puritis, rash, hives, warts, acne, eczema, changing in skin lesion Neuro: No weakness, tremor, incoordination, spasms, paresthesia, pain Psychiatric: Denies confusion, memory loss, sensory loss. Denies Depression. Endocrine: Denies change in weight, skin, hair change, nocturia, and paresthesia, diabetic polys, visual blurring,  hyper / hypo glycemic episodes.  Heme/Lymph: No excessive bleeding, bruising, enlarged lymph nodes.  Physical Exam  BP 116/78 mmHg  Pulse 64  Temp(Src) 97.3 F (36.3 C)  Resp 16  Ht 5' 8.5" (1.74 m)   Wt 167 lb 6.4 oz (75.932 kg)  BMI 25.08 kg/m2  General Appearance: Well nourished and in no apparent distress. Eyes: PERRLA, EOMs, conjunctiva no swelling or erythema, normal fundi and vessels. Sinuses: No frontal/maxillary tenderness ENT/Mouth: EACs patent / TMs  nl. Nares clear without erythema, swelling, mucoid exudates. Oral hygiene is good. No erythema, swelling, or exudate. Tongue normal, non-obstructing. Tonsils not swollen or erythematous. Hearing normal.  Neck: Supple, thyroid normal. No bruits, nodes or JVD. Respiratory: Respiratory effort normal.  BS equal and clear bilateral without rales, rhonci, wheezing or stridor. Cardio: Heart sounds are normal with regular rate and rhythm and no murmurs, rubs or gallops. Peripheral pulses are normal and equal bilaterally without edema. No aortic or femoral bruits. Chest: symmetric with normal excursions and percussion. Breasts: Symmetric, without lumps, nipple discharge, retractions, or fibrocystic changes.  Abdomen: Flat, soft, with bowl sounds. Nontender, no guarding, rebound, hernias, masses, or organomegaly.  Lymphatics: Non tender without lymphadenopathy.  Musculoskeletal: Full ROM all peripheral extremities, joint stability, 5/5 strength, and normal gait. Skin: Warm and dry without rashes, lesions, cyanosis, clubbing or  ecchymosis.  Neuro: Cranial nerves intact, reflexes equal bilaterally. Normal muscle tone, no cerebellar symptoms. Sensation intact.  Pysch: Alert and oriented X 3, normal affect, Insight and Judgment appropriate.   Assessment and Plan  1. Annual Preventative Screening Examination  - Microalbumin / creatinine urine ratio - EKG 12-Lead - [place], RETROPERITNL ABD,  LTD - POC Hemoccult Bld/Stl  - Urinalysis, Routine w reflex microscopic  - Vitamin B12 - Iron and TIBC - CBC with Differential/Platelet - BASIC METABOLIC PANEL WITH GFR - Hepatic function panel - Magnesium - Lipid panel - TSH - Hemoglobin A1c -  Insulin, random - VITAMIN D 25 Hydroxy   2. Elevated BP  - Microalbumin / creatinine urine ratio - EKG 12-Lead - [place], RETROPERITNL ABD,  LTD - TSH  3. Mixed hyperlipidemia  - Lipid panel - TSH  4. Prediabetes  - Hemoglobin A1c - Insulin, random  5. Vitamin D deficiency  - VITAMIN D 25 Hydroxy   6. Hypothyroidism   7. Other fatigue  - Iron and TIBC - CBC with Differential/Platelet - BASIC METABOLIC PANEL WITH GFR - TSH  8. Medication management  - Urinalysis, Routine w reflex microscopic  - Vitamin [id number] - BASIC METABOLIC PANEL WITH GFR - Hepatic function panel - Magnesium  9. Screening for rectal cancer  - POC Hemoccult Bld/Stl   10. Screening examination for pulmonary tuberculosis  - PPD  11. Need for prophylactic vaccination with combined diphtheria-tetanus-pertussis (DTP) vaccine  - Tdap vaccine greater than or equal to 7yo IM   Continue prudent diet as discussed, weight control, BP monitoring, regular exercise, and medications. Discussed med's effects and SE's. Screening labs and tests as requested with regular follow-up as recommended. Over 40 minutes of exam, counseling, chart review and high complex critical decision making was performed.

## 2015-09-18 NOTE — Patient Instructions (Signed)
Recommend Adult Low Dose Aspirin or   coated  Aspirin 81 mg daily   To reduce risk of Colon Cancer 20 %,   Skin Cancer 26 % ,   Melanoma 46%   and   Pancreatic cancer 60%   ++++++++++++++++++++++++++++++++++++++++++++++++++++++  Vitamin D goal   is between 70-100.   Please make sure that you are taking your Vitamin D as directed.   It is very important as a natural anti-inflammatory   helping hair, skin, and nails, as well as reducing stroke and heart attack risk.   It helps your bones and helps with mood.  It also decreases numerous cancer risks so please take it as directed.   Low Vit D is associated with a 200-300% higher risk for CANCER   and 200-300% higher risk for HEART   ATTACK  &  STROKE.   ......................................  It is also associated with higher death rate at younger ages,   autoimmune diseases like Rheumatoid arthritis, Lupus, Multiple Sclerosis.     Also many other serious conditions, like depression, Alzheimer's  Dementia, infertility, muscle aches, fatigue, fibromyalgia - just to name a few.  ++++++++++++++++++++++++++++++++++++++++++++++++  Recommend the book "The END of DIETING" by Dr Joel Fuhrman   & the book "The END of DIABETES " by Dr Joel Fuhrman  At [employer] - get book & Audio CD's     Being diabetic has a  300% increased risk for heart attack, stroke, cancer, and alzheimer- type vascular dementia. It is very important that you work harder with diet by avoiding all foods that are white. Avoid white rice (brown & wild rice is OK), white potatoes (sweetpotatoes in moderation is OK), White bread or wheat bread or anything made out of white flour like bagels, donuts, rolls, buns, biscuits, cakes, pastries, cookies, pizza crust, and pasta (made from white flour & egg whites) - vegetarian pasta or spinach or wheat pasta is OK. Multigrain breads like Arnold's or [employer], or multigrain sandwich thins or flatbreads.  Diet,  exercise and weight loss can reverse and cure diabetes in the early stages.  Diet, exercise and weight loss is very important in the control and prevention of complications of diabetes which affects every system in your body, ie. Brain - dementia/stroke, eyes - glaucoma/blindness, heart - heart attack/heart failure, kidneys - dialysis, stomach - gastric paralysis, intestines - malabsorption, nerves - severe painful neuritis, circulation - gangrene & loss of a leg(s), and finally cancer and Alzheimers.    I recommend avoid fried & greasy foods,  sweets/candy, white rice (brown or wild rice or Quinoa is OK), white potatoes (sweet potatoes are OK) - anything made from white flour - bagels, doughnuts, rolls, buns, biscuits,white and wheat breads, pizza crust and traditional pasta made of white flour & egg white(vegetarian pasta or spinach or wheat pasta is OK).  Multi-grain bread is OK - like multi-grain flat bread or sandwich thins. Avoid alcohol in excess. Exercise is also important.    Eat all the vegetables you want - avoid meat, especially red meat and dairy - especially cheese.  Cheese is the most concentrated form of trans-fats which is the worst thing to clog up our arteries. Veggie cheese is OK which can be found in the fresh produce section at [employer] or [employer] or [employer]  ++++++++++++++++++++++++++++++++++++++++++++++++++ DASH Eating Plan  DASH stands for "Dietary Approaches to Stop Hypertension."   The DASH eating plan is a healthy eating plan that has been shown to reduce high   blood pressure (hypertension). Additional health benefits may include reducing the risk of type 2 diabetes mellitus, heart disease, and stroke. The DASH eating plan may also help with weight loss.  WHAT DO I NEED TO KNOW ABOUT THE DASH EATING PLAN? For the DASH eating plan, you will follow these general guidelines:  Choose foods with a percent daily value for sodium of less than 5% (as listed on the food  label).  Use salt-free seasonings or herbs instead of table salt or sea salt.  Check with your health care provider or pharmacist before using salt substitutes.  Eat lower-sodium products, often labeled as "lower sodium" or "no salt added."  Eat fresh foods.  Eat more vegetables, fruits, and low-fat dairy products.    Choose whole grains. Look for the word "whole" as the first word in the ingredient list.  Choose fish   Limit sweets, desserts, sugars, and sugary drinks.  Choose heart-healthy fats.  Eat veggie cheese   Eat more home-cooked food and less restaurant, buffet, and fast food.  Limit fried foods.  Cook foods using methods other than frying.  Limit canned vegetables. If you do use them, rinse them well to decrease the sodium.  When eating at a restaurant, ask that your food be prepared with less salt, or no salt if possible.                      WHAT FOODS CAN I EAT?  Seek help from a dietitian for individual calorie needs.  Grains Whole grain or whole wheat bread. Brown rice. Whole grain or whole wheat pasta. Quinoa, bulgur, and whole grain cereals. Low-sodium cereals. Corn or whole wheat flour tortillas. Whole grain cornbread. Whole grain crackers. Low-sodium crackers.  Vegetables Fresh or frozen vegetables (raw, steamed, roasted, or grilled). Low-sodium or reduced-sodium tomato and vegetable juices. Low-sodium or reduced-sodium tomato sauce and paste. Low-sodium or reduced-sodium canned vegetables.   Fruits All fresh, canned (in natural juice), or frozen fruits.  Protein Products  All fish and seafood.  Dried beans, peas, or lentils. Unsalted nuts and seeds. Unsalted canned beans.  Dairy Low-fat dairy products, such as skim or 1% milk, 2% or reduced-fat cheeses, low-fat ricotta or cottage cheese, or plain low-fat yogurt. Low-sodium or reduced-sodium cheeses.  Fats and Oils Tub margarines without trans fats. Light or reduced-fat mayonnaise and salad  dressings (reduced sodium). Avocado. Safflower, olive, or canola oils. Natural peanut or almond butter.  Other Unsalted popcorn and pretzels. The items listed above may not be a complete list of recommended foods or beverages. Contact your dietitian for more options.  +++++++++++++++++++++++++++++++++++++++++++  WHAT FOODS ARE NOT RECOMMENDED?  Grains/ White flour or wheat flour  White bread. White pasta. White rice. Refined cornbread. Bagels and croissants. Crackers that contain trans fat.  Vegetables  Creamed or fried vegetables. Vegetables in a . Regular canned vegetables. Regular canned tomato sauce and paste. Regular tomato and vegetable juices.  Fruits Dried fruits. Canned fruit in light or heavy syrup. Fruit juice.  Meat and Other Protein Products Meat in general. Fatty cuts of meat. Ribs, chicken wings, bacon, sausage, bologna, salami, chitterlings, fatback, hot dogs, bratwurst, and packaged luncheon meats.  Dairy Whole or 2% milk, cream, half-and-half, and cream cheese. Whole-fat or sweetened yogurt. Full-fat cheeses or blue cheese. Nondairy creamers and whipped toppings. Processed cheese, cheese spreads, or cheese curds.  Condiments Onion and garlic salt, seasoned salt, table salt, and sea salt. Canned and packaged gravies. Worcestershire sauce. Tartar sauce.  Barbecue sauce. Teriyaki sauce. Soy sauce, including reduced sodium. Steak sauce. Fish sauce. Oyster sauce. Cocktail sauce. Horseradish. Ketchup and mustard. Meat flavorings and tenderizers. Bouillon cubes. Hot sauce. Tabasco sauce. Marinades. Taco seasonings. Relishes.  Fats and Oils Butter, stick margarine, lard, shortening, ghee, and bacon fat. Coconut, palm kernel, or palm oils. Regular salad dressings.  Pickles and olives. Salted popcorn and pretzels. The items listed above may not be a complete list of foods and beverages to avoid.   Preventive Care for Adults  A healthy lifestyle and preventive care can  promote health and wellness. Preventive health guidelines for women include the following key practices.  A routine yearly physical is a good way to check with your health care provider about your health and preventive screening. It is a chance to share any concerns and updates on your health and to receive a thorough exam.  Visit your dentist for a routine exam and preventive care every 6 months. Brush your teeth twice a day and floss once a day. Good oral hygiene prevents tooth decay and gum disease.  The frequency of eye exams is based on your age, health, family medical history, use of contact lenses, and other factors. Follow your health care provider's recommendations for frequency of eye exams.  Eat a healthy diet. Foods like vegetables, fruits, whole grains, low-fat dairy products, and lean protein foods contain the nutrients you need without too many calories. Decrease your intake of foods high in solid fats, added sugars, and salt. Eat the right amount of calories for you.Get information about a proper diet from your health care provider, if necessary.  Regular physical exercise is one of the most important things you can do for your health. Most adults should get at least 150 minutes of moderate-intensity exercise (any activity that increases your heart rate and causes you to sweat) each week. In addition, most adults need muscle-strengthening exercises on 2 or more days a week.  Maintain a healthy weight. The body mass index (BMI) is a screening tool to identify possible weight problems. It provides an estimate of body fat based on height and weight. Your health care provider can find your BMI and can help you achieve or maintain a healthy weight.For adults 20 years and older:  A BMI below 18.5 is considered underweight.  A BMI of 18.5 to 24.9 is normal.  A BMI of 25 to 29.9 is considered overweight.  A BMI of 30 and above is considered obese.  Maintain normal blood lipids and  cholesterol levels by exercising and minimizing your intake of saturated fat. Eat a balanced diet with plenty of fruit and vegetables. Blood tests for lipids and cholesterol should begin at age 31 and be repeated every 5 years. If your lipid or cholesterol levels are high, you are over 50, or you are at high risk for heart disease, you may need your cholesterol levels checked more frequently.Ongoing high lipid and cholesterol levels should be treated with medicines if diet and exercise are not working.  If you smoke, find out from your health care provider how to quit. If you do not use tobacco, do not start.  Lung cancer screening is recommended for adults aged 83-80 years who are at high risk for developing lung cancer because of a history of smoking. A yearly low-dose CT scan of the lungs is recommended for people who have at least a 30-pack-year history of smoking and are a current smoker or have quit within the past 15 years.  A pack year of smoking is smoking an average of 1 pack of cigarettes a day for 1 year (for example: 1 pack a day for 30 years or 2 packs a day for 15 years). Yearly screening should continue until the smoker has stopped smoking for at least 15 years. Yearly screening should be stopped for people who develop a health problem that would prevent them from having lung cancer treatment.  High blood pressure causes heart disease and increases the risk of stroke. Your blood pressure should be checked at least every 1 to 2 years. Ongoing high blood pressure should be treated with medicines if weight loss and exercise do not work.  If you are 61-30 years old, ask your health care provider if you should take aspirin to prevent strokes.  Diabetes screening involves taking a blood sample to check your fasting blood sugar level. This should be done once every 3 years, after age 9, if you are within normal weight and without risk factors for diabetes. Testing should be considered at a  younger age or be carried out more frequently if you are overweight and have at least 1 risk factor for diabetes.  Breast cancer screening is essential preventive care for women. You should practice "breast self-awareness." This means understanding the normal appearance and feel of your breasts and may include breast self-examination. Any changes detected, no matter how small, should be reported to a health care provider. Women in their 74s and 30s should have a clinical breast exam (CBE) by a health care provider as part of a regular health exam every 1 to 3 years. After age 75, women should have a CBE every year. Starting at age 19, women should consider having a mammogram (breast X-ray test) every year. Women who have a family history of breast cancer should talk to their health care provider about genetic screening. Women at a high risk of breast cancer should talk to their health care providers about having an MRI and a mammogram every year.  Breast cancer gene (BRCA)-related cancer risk assessment is recommended for women who have family members with BRCA-related cancers. BRCA-related cancers include breast, ovarian, tubal, and peritoneal cancers. Having family members with these cancers may be associated with an increased risk for harmful changes (mutations) in the breast cancer genes BRCA1 and BRCA2. Results of the assessment will determine the need for genetic counseling and BRCA1 and BRCA2 testing.  Routine pelvic exams to screen for cancer are no longer recommended for nonpregnant women who are considered low risk for cancer of the pelvic organs (ovaries, uterus, and vagina) and who do not have symptoms. Ask your health care provider if a screening pelvic exam is right for you.  If you have had past treatment for cervical cancer or a condition that could lead to cancer, you need Pap tests and screening for cancer for at least 20 years after your treatment. If Pap tests have been discontinued, your  risk factors (such as having a new sexual partner) need to be reassessed to determine if screening should be resumed. Some women have medical problems that increase the chance of getting cervical cancer. In these cases, your health care provider may recommend more frequent screening and Pap tests.  Colorectal cancer can be detected and often prevented. Most routine colorectal cancer screening begins at the age of 23 years and continues through age 42 years. However, your health care provider may recommend screening at an earlier age if you have risk factors for colon  cancer. On a yearly basis, your health care provider may provide home test kits to check for hidden blood in the stool. Use of a small camera at the end of a tube, to directly examine the colon (sigmoidoscopy or colonoscopy), can detect the earliest forms of colorectal cancer. Talk to your health care provider about this at age 60, when routine screening begins. Direct exam of the colon should be repeated every 5-10 years through age 71 years, unless early forms of pre-cancerous polyps or small growths are found.  Hepatitis C blood testing is recommended for all people born from [birth year] through 1965 and any individual with known risks for hepatitis C.  Pra  Osteoporosis is a disease in which the bones lose minerals and strength with aging. This can result in serious bone fractures or breaks. The risk of osteoporosis can be identified using a bone density scan. Women ages 47 years and over and women at risk for fractures or osteoporosis should discuss screening with their health care providers. Ask your health care provider whether you should take a calcium supplement or vitamin D to reduce the rate of osteoporosis.  Menopause can be associated with physical symptoms and risks. Hormone replacement therapy is available to decrease symptoms and risks. You should talk to your health care provider about whether hormone replacement therapy is right  for you.  Use sunscreen. Apply sunscreen liberally and repeatedly throughout the day. You should seek shade when your shadow is shorter than you. Protect yourself by wearing long sleeves, pants, a wide-brimmed hat, and sunglasses year round, whenever you are outdoors.  Once a month, do a whole body skin exam, using a mirror to look at the skin on your back. Tell your health care provider of new moles, moles that have irregular borders, moles that are larger than a pencil eraser, or moles that have changed in shape or color.  Stay current with required vaccines (immunizations).  Influenza vaccine. All adults should be immunized every year.  Tetanus, diphtheria, and acellular pertussis (Td, Tdap) vaccine. Pregnant women should receive 1 dose of Tdap vaccine during each pregnancy. The dose should be obtained regardless of the length of time since the last dose. Immunization is preferred during the 27th-36th week of gestation. An adult who has not previously received Tdap or who does not know her vaccine status should receive 1 dose of Tdap. This initial dose should be followed by tetanus and diphtheria toxoids (Td) booster doses every 10 years. Adults with an unknown or incomplete history of completing a 3-dose immunization series with Td-containing vaccines should begin or complete a primary immunization series including a Tdap dose. Adults should receive a Td booster every 10 years.  Varicella vaccine. An adult without evidence of immunity to varicella should receive 2 doses or a second dose if she has previously received 1 dose. Pregnant females who do not have evidence of immunity should receive the first dose after pregnancy. This first dose should be obtained before leaving the health care facility. The second dose should be obtained 4-8 weeks after the first dose.  Human papillomavirus (HPV) vaccine. Females aged 13-26 years who have not received the vaccine previously should obtain the 3-dose  series. The vaccine is not recommended for use in pregnant females. However, pregnancy testing is not needed before receiving a dose. If a female is found to be pregnant after receiving a dose, no treatment is needed. In that case, the remaining doses should be delayed until after the pregnancy. Immunization  is recommended for any person with an immunocompromised condition through the age of 51 years if she did not get any or all doses earlier. During the 3-dose series, the second dose should be obtained 4-8 weeks after the first dose. The third dose should be obtained 24 weeks after the first dose and 16 weeks after the second dose.  Zoster vaccine. One dose is recommended for adults aged 9 years or older unless certain conditions are present.  Measles, mumps, and rubella (MMR) vaccine. Adults born before [birth year] generally are considered immune to measles and mumps. Adults born in [birth year] or later should have 1 or more doses of MMR vaccine unless there is a contraindication to the vaccine or there is laboratory evidence of immunity to each of the three diseases. A routine second dose of MMR vaccine should be obtained at least 28 days after the first dose for students attending postsecondary schools, health care workers, or international travelers. People who received inactivated measles vaccine or an unknown type of measles vaccine during 1963-1967 should receive 2 doses of MMR vaccine. People who received inactivated mumps vaccine or an unknown type of mumps vaccine before 1979 and are at high risk for mumps infection should consider immunization with 2 doses of MMR vaccine. For females of childbearing age, rubella immunity should be determined. If there is no evidence of immunity, females who are not pregnant should be vaccinated. If there is no evidence of immunity, females who are pregnant should delay immunization until after pregnancy. Unvaccinated health care workers born before [birth year] who lack laboratory  evidence of measles, mumps, or rubella immunity or laboratory confirmation of disease should consider measles and mumps immunization with 2 doses of MMR vaccine or rubella immunization with 1 dose of MMR vaccine.  Pneumococcal 13-valent conjugate (PCV13) vaccine. When indicated, a person who is uncertain of her immunization history and has no record of immunization should receive the PCV13 vaccine. An adult aged 12 years or older who has certain medical conditions and has not been previously immunized should receive 1 dose of PCV13 vaccine. This PCV13 should be followed with a dose of pneumococcal polysaccharide (PPSV23) vaccine. The PPSV23 vaccine dose should be obtained at least 8 weeks after the dose of PCV13 vaccine. An adult aged 53 years or older who has certain medical conditions and previously received 1 or more doses of PPSV23 vaccine should receive 1 dose of PCV13. The PCV13 vaccine dose should be obtained 1 or more years after the last PPSV23 vaccine dose.    Pneumococcal polysaccharide (PPSV23) vaccine. When PCV13 is also indicated, PCV13 should be obtained first. All adults aged 89 years and older should be immunized. An adult younger than age 60 years who has certain medical conditions should be immunized. Any person who resides in a nursing home or long-term care facility should be immunized. An adult smoker should be immunized. People with an immunocompromised condition and certain other conditions should receive both PCV13 and PPSV23 vaccines. People with human immunodeficiency virus (HIV) infection should be immunized as soon as possible after diagnosis. Immunization during chemotherapy or radiation therapy should be avoided. Routine use of PPSV23 vaccine is not recommended for American Indians, [place] Natives, or people younger than 65 years unless there are medical conditions that require PPSV23 vaccine. When indicated, people who have unknown immunization and have no record of immunization  should receive PPSV23 vaccine. One-time revaccination 5 years after the first dose of PPSV23 is recommended for people aged 19-64 years who have  chronic kidney failure, nephrotic syndrome, asplenia, or immunocompromised conditions. People who received 1-2 doses of PPSV23 before age 28 years should receive another dose of PPSV23 vaccine at age 64 years or later if at least 5 years have passed since the previous dose. Doses of PPSV23 are not needed for people immunized with PPSV23 at or after age 65 years.  Preventive Services / Frequency   Ages 52 to 76 years  Blood pressure check.  Lipid and cholesterol check.  Lung cancer screening. / Every year if you are aged 62-80 years and have a 30-pack-year history of smoking and currently smoke or have quit within the past 15 years. Yearly screening is stopped once you have quit smoking for at least 15 years or develop a health problem that would prevent you from having lung cancer treatment.  Clinical breast exam.** / Every year after age 38 years.  BRCA-related cancer risk assessment.** / For women who have family members with a BRCA-related cancer (breast, ovarian, tubal, or peritoneal cancers).  Mammogram.** / Every year beginning at age 69 years and continuing for as long as you are in good health. Consult with your health care provider.  Pap test.** / Every 3 years starting at age 29 years through age 47 or 54 years with a history of 3 consecutive normal Pap tests.  HPV screening.** / Every 3 years from ages 1 years through ages 108 to 42 years with a history of 3 consecutive normal Pap tests.  Fecal occult blood test (FOBT) of stool. / Every year beginning at age 36 years and continuing until age 71 years. You may not need to do this test if you get a colonoscopy every 10 years.  Flexible sigmoidoscopy or colonoscopy.** / Every 5 years for a flexible sigmoidoscopy or every 10 years for a colonoscopy beginning at age 65 years and continuing  until age 21 years.  Hepatitis C blood test.** / For all people born from [birth year] through 1964 and any individual with known risks for hepatitis C.  Skin self-exam. / Monthly.  Influenza vaccine. / Every year.  Tetanus, diphtheria, and acellular pertussis (Tdap/Td) vaccine.** / Consult your health care provider. Pregnant women should receive 1 dose of Tdap vaccine during each pregnancy. 1 dose of Td every 10 years.  Varicella vaccine.** / Consult your health care provider. Pregnant females who do not have evidence of immunity should receive the first dose after pregnancy.  Zoster vaccine.** / 1 dose for adults aged 69 years or older.  Pneumococcal 13-valent conjugate (PCV13) vaccine.** / Consult your health care provider.  Pneumococcal polysaccharide (PPSV23) vaccine.** / 1 to 2 doses if you smoke cigarettes or if you have certain conditions.  Meningococcal vaccine.** / Consult your health care provider.  Hepatitis A vaccine.** / Consult your health care provider.  Hepatitis B vaccine.** / Consult your health care provider. Screening for abdominal aortic aneurysm (AAA)  by ultrasound is recommended for people over 50 who have history of high blood pressure or who are current or former smokers.

## 2015-09-19 LAB — URINALYSIS, ROUTINE W REFLEX MICROSCOPIC
Bilirubin Urine: NEGATIVE
GLUCOSE, UA: NEGATIVE
Hgb urine dipstick: NEGATIVE
Ketones, ur: NEGATIVE
LEUKOCYTES UA: NEGATIVE
Nitrite: NEGATIVE
PH: 7 (ref 5.0–8.0)
Protein, ur: NEGATIVE
Specific Gravity, Urine: 1.005 (ref 1.001–1.035)

## 2015-09-19 LAB — MICROALBUMIN / CREATININE URINE RATIO
Creatinine, Urine: 20 mg/dL (ref 20–320)
Microalb, Ur: <0.2 mg/dL

## 2015-09-19 LAB — VITAMIN D 25 HYDROXY (VIT D DEFICIENCY, FRACTURES): VIT D 25 HYDROXY: 31 ng/mL (ref 30–100)

## 2015-09-19 LAB — INSULIN, RANDOM: Insulin: 7.6 u[IU]/mL (ref 2.0–19.6)

## 2015-09-20 LAB — TB SKIN TEST
INDURATION: 0 mm
TB SKIN TEST: NEGATIVE

## 2015-10-05 ENCOUNTER — Telehealth: Payer: Self-pay | Admitting: *Deleted

## 2015-10-05 NOTE — Telephone Encounter (Signed)
Patient called and requested the diagnosis codes for the Cologuard test, sothat she can get a pre-determination from her insurance company.  Per Dr Melford Aase, the codes are Z12.11 and Z12.12.  Th information was left on her VM.

## 2015-10-23 ENCOUNTER — Other Ambulatory Visit: Payer: Self-pay | Admitting: Internal Medicine

## 2015-12-26 ENCOUNTER — Other Ambulatory Visit: Payer: Self-pay | Admitting: Internal Medicine

## 2016-03-04 ENCOUNTER — Other Ambulatory Visit: Payer: Self-pay | Admitting: Physician Assistant

## 2016-03-05 ENCOUNTER — Other Ambulatory Visit: Payer: Self-pay | Admitting: *Deleted

## 2016-03-05 MED ORDER — LEVOTHYROXINE SODIUM 125 MCG PO TABS: ORAL_TABLET | ORAL | Status: DC

## 2016-06-17 ENCOUNTER — Other Ambulatory Visit: Payer: Self-pay | Admitting: Internal Medicine

## 2016-08-10 ENCOUNTER — Encounter: Payer: Self-pay | Admitting: *Deleted

## 2016-09-19 ENCOUNTER — Other Ambulatory Visit: Payer: Self-pay | Admitting: Internal Medicine

## 2016-10-08 ENCOUNTER — Encounter: Payer: Self-pay | Admitting: Internal Medicine

## 2016-10-17 ENCOUNTER — Encounter: Payer: Self-pay | Admitting: Internal Medicine

## 2016-12-12 ENCOUNTER — Encounter: Payer: Self-pay | Admitting: Internal Medicine

## 2016-12-16 ENCOUNTER — Other Ambulatory Visit: Payer: Self-pay | Admitting: Internal Medicine

## 2017-01-11 ENCOUNTER — Other Ambulatory Visit: Payer: Self-pay | Admitting: Physician Assistant

## 2017-01-12 NOTE — Progress Notes (Signed)
Complete Physical  Assessment and Plan: Jodi Graham was seen today for annual exam.  Diagnoses and all orders for this visit:  Encounter for general adult medical examination with abnormal findings Declines EKG Discussed cologuard, will call insurance  Hypothyroidism, unspecified type Hypothyroidism-check TSH level, continue medications the same, reminded to take on an empty stomach 30-8mins before food.  -     TSH  Vitamin D deficiency -     VITAMIN D 25 Hydroxy (Vit-D Deficiency, Fractures)  Hypertension, unspecified type - continue medications, DASH diet, exercise and monitor at home. Call if greater than 130/80.  -     Urinalysis, Routine w reflex microscopic -     Microalbumin / creatinine urine ratio  Mixed hyperlipidemia -continue medications, check lipids, decrease fatty foods, increase activity.  -     Lipid panel  Prediabetes Discussed general issues about diabetes pathophysiology and management., Educational material distributed., Suggested low cholesterol diet., Encouraged aerobic exercise., Discussed foot care., Reminded to get yearly retinal exam. -     Hemoglobin A1c  Medication management -     CBC with Differential/Platelet -     BASIC METABOLIC PANEL WITH GFR -     Hepatic function panel -     Magnesium  Anemia, unspecified type -     Iron and TIBC -     Vitamin B12   Discussed med's effects and SE's. Screening labs and tests as requested with regular follow-up as recommended. Over 40 minutes of exam, counseling, chart review, and complex, high level critical decision making was performed this visit.   HPI  56 y.o. female  presents for a complete physical and follow up for has Hypothyroidism; Vitamin D deficiency; Hypertension; Mixed hyperlipidemia; Prediabetes; and Medication management on her problem list..  Her blood pressure has been controlled at home, today their BP is BP: 122/76 She does not workout. She denies chest pain, shortness of breath,  dizziness.   She is on cholesterol medication and denies myalgias. Her cholesterol is at goal. The cholesterol last visit was:   Lab Results  Component Value Date   CHOL 219 (H) 09/18/2015   HDL 75 09/18/2015   LDLCALC 114 09/18/2015   TRIG 151 (H) 09/18/2015   CHOLHDL 2.9 09/18/2015   Last A1C in the office was:  Lab Results  Component Value Date   HGBA1C 5.4 09/18/2015   Patient is on Vitamin D supplement.   Lab Results  Component Value Date   VD25OH 31 09/18/2015     She is on thyroid medication. Her medication was changed last visit.   Lab Results  Component Value Date   TSH 0.752 09/18/2015  .  BMI is Body mass index is 25.51 kg/m., she is working on diet and exercise. Wt Readings from Last 3 Encounters:  01/14/17 167 lb 12.8 oz (76.1 kg)  09/18/15 167 lb 6.4 oz (75.9 kg)  08/17/14 155 lb 9.6 oz (70.6 kg)    Current Medications:  Current Outpatient Prescriptions on File Prior to Visit  Medication Sig Dispense Refill  . cholecalciferol (VITAMIN D) 1000 UNITS tablet Take 1,000 Units by mouth daily.    Marland Kitchen levothyroxine (SYNTHROID, LEVOTHROID) 125 MCG tablet TAKE ONE TABLET BY MOUTH DAILY FOR THYROID **MUST CALL MD FOR APPOINTMENT FOR MORE REFILLS 30 tablet 0   No current facility-administered medications on file prior to visit.    Allergies:  Allergies  Allergen Reactions  . Codeine    Medical History:  She has Hypothyroidism; Vitamin D deficiency; Hypertension;  Mixed hyperlipidemia; Prediabetes; and Medication management on her problem list.  Health Maintenance:   Immunization History  Administered Date(s) Administered  . PPD Test 08/09/2013, 09/18/2015  . Pneumococcal Polysaccharide-23 07/29/2012  . Td 04/15/2005  . Tdap 09/18/2015    Tetanus: 2017 Pneumovax: 2013 Prevnar 13:  Flu vaccine: Zostavax:  LMP: Pap: MGM: 05/2016  DEXA: Colonoscopy: DUE, long discussion will try to get cologuard, LONG discussion EGD: CXR 2011  Last Dental Exam:   Last Eye Exam: Patient Care Team: Unk Pinto, MD as PCP - General (Internal Medicine)  Surgical History:  She has no past surgical history on file. Family History:  Herfamily history includes Lymphoma in her father. Social History:  She reports that she has never smoked. She has never used smokeless tobacco. She reports that she drinks about 1.0 oz of alcohol per week . She reports that she does not use drugs.  Review of Systems: Review of Systems  Constitutional: Negative.   HENT: Negative.   Eyes: Negative.   Respiratory: Negative.   Cardiovascular: Negative.   Gastrointestinal: Negative.   Genitourinary: Negative.   Musculoskeletal: Negative.   Skin: Negative.   Neurological: Negative.   Endo/Heme/Allergies: Negative.   Psychiatric/Behavioral: Negative.     Physical Exam: Estimated body mass index is 25.51 kg/m as calculated from the following:   Height as of this encounter: 5\' 8"  (1.727 m).   Weight as of this encounter: 167 lb 12.8 oz (76.1 kg). BP 122/76   Pulse 71   Temp 97.7 F (36.5 C)   Resp 14   Ht 5\' 8"  (1.727 m)   Wt 167 lb 12.8 oz (76.1 kg)   SpO2 97%   BMI 25.51 kg/m  General Appearance: Well nourished, in no apparent distress.  Eyes: PERRLA, EOMs, conjunctiva no swelling or erythema, normal fundi and vessels.  Sinuses: No Frontal/maxillary tenderness  ENT/Mouth: Ext aud canals clear, normal light reflex with TMs without erythema, bulging. Good dentition. No erythema, swelling, or exudate on post pharynx. Tonsils not swollen or erythematous. Hearing normal.  Neck: Supple, thyroid normal. No bruits  Respiratory: Respiratory effort normal, BS equal bilaterally without rales, rhonchi, wheezing or stridor.  Cardio: RRR without murmurs, rubs or gallops. Brisk peripheral pulses without edema.  Chest: symmetric, with normal excursions and percussion.  Breasts: Symmetric, without lumps, nipple discharge, retractions.  Abdomen: Soft, nontender, no  guarding, rebound, hernias, masses, or organomegaly.  Lymphatics: Non tender without lymphadenopathy.  Genitourinary: defer Musculoskeletal: Full ROM all peripheral extremities,5/5 strength, and normal gait.  Skin: Warm, dry without rashes, lesions, ecchymosis. Neuro: Cranial nerves intact, reflexes equal bilaterally. Normal muscle tone, no cerebellar symptoms. Sensation intact.  Psych: Awake and oriented X 3, normal affect, Insight and Judgment appropriate.   EKG: WNL no ST changes. AORTA SCAN: WNL   Vicie Mutters 10:24 AM [facility]

## 2017-01-14 ENCOUNTER — Encounter: Payer: Self-pay | Admitting: Physician Assistant

## 2017-01-14 ENCOUNTER — Ambulatory Visit (INDEPENDENT_AMBULATORY_CARE_PROVIDER_SITE_OTHER): Payer: 59 | Admitting: Physician Assistant

## 2017-01-14 VITALS — BP 122/76 | HR 71 | Temp 97.7°F | Resp 14 | Ht 68.0 in | Wt 167.8 lb

## 2017-01-14 DIAGNOSIS — E559 Vitamin D deficiency, unspecified: Secondary | ICD-10-CM

## 2017-01-14 DIAGNOSIS — Z0000 Encounter for general adult medical examination without abnormal findings: Secondary | ICD-10-CM | POA: Diagnosis not present

## 2017-01-14 DIAGNOSIS — E782 Mixed hyperlipidemia: Secondary | ICD-10-CM

## 2017-01-14 DIAGNOSIS — I10 Essential (primary) hypertension: Secondary | ICD-10-CM

## 2017-01-14 DIAGNOSIS — R7303 Prediabetes: Secondary | ICD-10-CM

## 2017-01-14 DIAGNOSIS — E039 Hypothyroidism, unspecified: Secondary | ICD-10-CM

## 2017-01-14 DIAGNOSIS — Z79899 Other long term (current) drug therapy: Secondary | ICD-10-CM

## 2017-01-14 DIAGNOSIS — D649 Anemia, unspecified: Secondary | ICD-10-CM

## 2017-01-14 DIAGNOSIS — Z0001 Encounter for general adult medical examination with abnormal findings: Secondary | ICD-10-CM

## 2017-01-14 LAB — CBC WITH DIFFERENTIAL/PLATELET
BASOS ABS: 73 {cells}/uL (ref 0–200)
Basophils Relative: 1 %
EOS ABS: 365 {cells}/uL (ref 15–500)
Eosinophils Relative: 5 %
HEMATOCRIT: 40.2 % (ref 35.0–45.0)
HEMOGLOBIN: 13.4 g/dL (ref 11.7–15.5)
LYMPHS ABS: 2117 {cells}/uL (ref 850–3900)
Lymphocytes Relative: 29 %
MCH: 30 pg (ref 27.0–33.0)
MCHC: 33.3 g/dL (ref 32.0–36.0)
MCV: 90.1 fL (ref 80.0–100.0)
MPV: 9.9 fL (ref 7.5–12.5)
Monocytes Absolute: 657 cells/uL (ref 200–950)
Monocytes Relative: 9 %
NEUTROS ABS: 4088 {cells}/uL (ref 1500–7800)
NEUTROS PCT: 56 %
Platelets: 310 10*3/uL (ref 140–400)
RBC: 4.46 MIL/uL (ref 3.80–5.10)
RDW: 13.6 % (ref 11.0–15.0)
WBC: 7.3 10*3/uL (ref 3.8–10.8)

## 2017-01-14 LAB — TSH: TSH: 1.07 mIU/L

## 2017-01-14 NOTE — Patient Instructions (Addendum)
Cologuard is an easy to use noninvasive colon cancer screening test based on the latest advances in stool DNA science.   Colon cancer is 3rd most diagnosed cancer and 2nd leading cause of death in both men and women 55 years of age and older despite being one of the most preventable and treatable cancers if found early.  4 of out 5 people diagnosed with colon cancer have NO prior family history.  When caught EARLY 90% of colon cancer is curable.   You have agreed to do a Cologuard screening and have declined a colonoscopy in spite of being explained the risks and benefits of the colonoscopy in detail, including cancer and death. Please understand that this is test not as sensitive or specific as a colonoscopy and you are still recommended to get a colonoscopy.   If you are NOT medicare please call your insurance company and give them these items to see if they will cover it: 1) CPT code, [phone] 2) Provider is Probation officer 3) [employer] NPI [phone] 4) [place] Tax ID [phone]  Out-of-pocket cost for Cologuard can range from $0 - $649 so please call  You will receive a short call from [place] support center at [employer], when you receive a call they will say they are from [place],  to confirm your mailing address and give you more information.  When they calll you, it will appear on the caller ID as "[employer]" or in some cases only this number will appear, [phone].   [employer] will ship your collection kit directly to you. You will collect a single stool sample in the privacy of your own home, no special preparation required. You will return the kit via [place] pre-paid shipping or pick-up, in the same box it arrived in. Then I will contact you to discuss your results after I receive them from the laboratory.   If you have any questions or concerns, Cologuard Customer Support Specialist are available 24 hours a  day, 7 days a week at [phone] or go to [email or web address].      Tumeric with black pepper extract is a great natural antiinflammatory that helps with arthritis and aches and pain. Can get from [employer] or any health food store. Need to take at least 818m twice a day with food.

## 2017-01-15 LAB — HEPATIC FUNCTION PANEL
ALBUMIN: 4.4 g/dL (ref 3.6–5.1)
ALK PHOS: 83 U/L (ref 33–130)
ALT: 29 U/L (ref 6–29)
AST: 26 U/L (ref 10–35)
BILIRUBIN INDIRECT: 0.8 mg/dL (ref 0.2–1.2)
BILIRUBIN TOTAL: 1 mg/dL (ref 0.2–1.2)
Bilirubin, Direct: 0.2 mg/dL (ref <=0.2)
Total Protein: 7.1 g/dL (ref 6.1–8.1)

## 2017-01-15 LAB — LIPID PANEL
Cholesterol: 230 mg/dL — ABNORMAL HIGH (ref <200)
HDL: 80 mg/dL (ref >50)
LDL CALC: 129 mg/dL — AB (ref <100)
TRIGLYCERIDES: 107 mg/dL (ref <150)
Total CHOL/HDL Ratio: 2.9 Ratio (ref <5.0)
VLDL: 21 mg/dL (ref <30)

## 2017-01-15 LAB — URINALYSIS, ROUTINE W REFLEX MICROSCOPIC
Bilirubin Urine: NEGATIVE
GLUCOSE, UA: NEGATIVE
Hgb urine dipstick: NEGATIVE
Ketones, ur: NEGATIVE
LEUKOCYTES UA: NEGATIVE
Nitrite: NEGATIVE
PROTEIN: NEGATIVE
Specific Gravity, Urine: 1.008 (ref 1.001–1.035)
pH: 6.5 (ref 5.0–8.0)

## 2017-01-15 LAB — BASIC METABOLIC PANEL WITH GFR
BUN: 12 mg/dL (ref 7–25)
CALCIUM: 9.2 mg/dL (ref 8.6–10.4)
CHLORIDE: 104 mmol/L (ref 98–110)
CO2: 26 mmol/L (ref 20–31)
CREATININE: 0.71 mg/dL (ref 0.50–1.05)
GFR, Est African American: >89 mL/min (ref >=60)
GFR, Est Non African American: >89 mL/min (ref >=60)
GLUCOSE: 83 mg/dL (ref 65–99)
Potassium: 4.4 mmol/L (ref 3.5–5.3)
Sodium: 141 mmol/L (ref 135–146)

## 2017-01-15 LAB — HEMOGLOBIN A1C
Hgb A1c MFr Bld: 5.1 % (ref <5.7)
Mean Plasma Glucose: 100 mg/dL

## 2017-01-15 LAB — IRON AND TIBC
%SAT: 41 % (ref 11–50)
IRON: 140 ug/dL (ref 45–160)
TIBC: 344 ug/dL (ref 250–450)
UIBC: 204 ug/dL (ref 125–400)

## 2017-01-15 LAB — MICROALBUMIN / CREATININE URINE RATIO
Creatinine, Urine: 57 mg/dL (ref 20–320)
MICROALB UR: 0.2 mg/dL
MICROALB/CREAT RATIO: 4 ug/mg{creat} (ref <30)

## 2017-01-15 LAB — VITAMIN D 25 HYDROXY (VIT D DEFICIENCY, FRACTURES): VIT D 25 HYDROXY: 37 ng/mL (ref 30–100)

## 2017-01-15 LAB — MAGNESIUM: Magnesium: 2.1 mg/dL (ref 1.5–2.5)

## 2017-01-15 LAB — VITAMIN B12: Vitamin B-12: 468 pg/mL (ref 200–1100)

## 2017-01-15 NOTE — Progress Notes (Signed)
Pt aware of lab results & voiced understanding of those results.

## 2017-01-15 NOTE — Progress Notes (Signed)
LVM for pt to return office call for LAB results.

## 2017-01-16 ENCOUNTER — Encounter: Payer: Self-pay | Admitting: Internal Medicine

## 2017-02-09 ENCOUNTER — Other Ambulatory Visit: Payer: Self-pay | Admitting: Internal Medicine

## 2017-08-25 ENCOUNTER — Telehealth: Payer: Self-pay | Admitting: Physician Assistant

## 2017-08-25 MED ORDER — PREDNISONE 20 MG PO TABS: ORAL_TABLET | ORAL | 0 refills | Status: DC

## 2017-08-25 MED ORDER — AZITHROMYCIN 250 MG PO TABS: ORAL_TABLET | ORAL | 1 refills | Status: AC

## 2017-08-25 MED ORDER — BENZONATATE 100 MG PO CAPS: 100.0000 mg | ORAL_CAPSULE | Freq: Three times a day (TID) | ORAL | 0 refills | Status: DC | PRN

## 2017-08-25 NOTE — Telephone Encounter (Signed)
Patient notified

## 2017-08-25 NOTE — Telephone Encounter (Signed)
Patient calling with cough x 1 month, taking aleve, cough drops, not improving, with low grade fever, sinus drainage, pressure, cough with dark yellow mucus. No wheezing SOB chest pain.   Will send in prednisone, zpak, tessalon, get on allergy pill.

## 2017-10-01 DIAGNOSIS — Z1231 Encounter for screening mammogram for malignant neoplasm of breast: Secondary | ICD-10-CM | POA: Diagnosis not present

## 2017-10-01 LAB — HM MAMMOGRAPHY

## 2017-11-04 ENCOUNTER — Encounter: Payer: Self-pay | Admitting: Adult Health

## 2017-11-04 ENCOUNTER — Ambulatory Visit (INDEPENDENT_AMBULATORY_CARE_PROVIDER_SITE_OTHER): Payer: 59 | Admitting: Adult Health

## 2017-11-04 VITALS — BP 140/90 | HR 70 | Temp 97.5°F | Ht 68.0 in | Wt 166.0 lb

## 2017-11-04 DIAGNOSIS — R509 Fever, unspecified: Secondary | ICD-10-CM | POA: Diagnosis not present

## 2017-11-04 DIAGNOSIS — R21 Rash and other nonspecific skin eruption: Secondary | ICD-10-CM

## 2017-11-04 DIAGNOSIS — J029 Acute pharyngitis, unspecified: Secondary | ICD-10-CM

## 2017-11-04 LAB — POCT RAPID STREP A (OFFICE): RAPID STREP A SCREEN: NEGATIVE

## 2017-11-04 MED ORDER — PREDNISONE 20 MG PO TABS: ORAL_TABLET | ORAL | 0 refills | Status: DC

## 2017-11-04 MED ORDER — AZITHROMYCIN 250 MG PO TABS: ORAL_TABLET | ORAL | 1 refills | Status: AC

## 2017-11-04 NOTE — Progress Notes (Signed)
Assessment and Plan:  Cameka was seen today for rash.  Diagnoses and all orders for this visit:  Fever, unspecified fever cause -     CBC with Differential/Platelet -     BASIC METABOLIC PANEL WITH GFR -     Cancel: Urinalysis w microscopic + reflex cultur -     Epstein-Barr virus VCA antibody panel -     POCT rapid strep A -     Urinalysis, Routine w reflex microscopic  Rash and nonspecific skin eruption       Will prescribe prednisone, switch new allergy medication, take benadryl + ranitidine -     Epstein-Barr virus VCA antibody panel  Sore throat -     Epstein-Barr virus VCA antibody panel -     POCT rapid strep A - negative, however will proceed with broad treatment -     predniSONE (DELTASONE) 20 MG tablet; 2 tablets daily for 3 days, 1 tablet daily for 4 days. -     azithromycin (ZITHROMAX) 250 MG tablet; Take 2 tablets (500 mg) on  Day 1,  followed by 1 tablet (250 mg) once daily on Days 2 through 5.  Go to the ER if any chest pain, shortness of breath, nausea, dizziness, severe HA, changes vision/speech  Further disposition pending results of labs. Discussed med's effects and SE's.   Over 15 minutes of exam, counseling, chart review, and critical decision making was performed.   Future Appointments  Date Time Provider [place]  01/15/2018 10:00 AM Vicie Mutters, PA-C [id number] None   ------------------------------------------------------------------------------------------------------------------  HPI BP 140/90   Pulse 70   Temp (!) 97.5 F (36.4 C)   Ht 5\' 8"  (1.727 m)   Wt 166 lb (75.3 kg)   SpO2 98%   BMI 25.24 kg/m   57 y.o.female presents for new onset rash that began this morning - erythematous, non-raised, itchy- she reports she noted some itching this morning around 2AM.   She reports ongoing sore throat, non-productive cough, congestion, headache ongoing since Thursday. Headache has been mainly frontal/sinus - leaning more to the right. Has  been taking aleve for this. She denies dyspnea/chest pain, neck stiffness/pain, nausea, vomiting, abdominal pain. Endorses one episode of loose stool. Denies vision changes or dizziness.   She has not taken new medications other than allergy medication that has been taking since January. She has taken aleve and throat lozenges for cold symptoms.  Past Medical History:  Diagnosis Date  . DDD (degenerative disc disease)   . Hypertension 2009/2011   labile  . Hypothyroidism      Allergies  Allergen Reactions  . Codeine     Current Outpatient Medications on File Prior to Visit  Medication Sig  . cholecalciferol (VITAMIN D) 1000 UNITS tablet Take 1,000 Units by mouth daily.  Marland Kitchen levothyroxine (SYNTHROID, LEVOTHROID) 125 MCG tablet TAKE ONE TABLET BY MOUTH DAILY FOR THYROID **MUST CALL MD FOR APPOINTMENT FOR MORE REFILLS (Patient taking differently: Take 1/2 tablet by mouth Mon-Fri and 1 tablet on Saturday & Sunday)  . benzonatate (TESSALON PERLES) 100 MG capsule Take 1 capsule (100 mg total) by mouth 3 (three) times daily as needed for cough (Max: 600mg  per day). (Patient not taking: Reported on 11/04/2017)  . predniSONE (DELTASONE) 20 MG tablet 2 tablets daily for 3 days, 1 tablet daily for 4 days. (Patient not taking: Reported on 11/04/2017)   No current facility-administered medications on file prior to visit.     ROS: Review of Systems  Constitutional:  Positive for fever (low grade) and malaise/fatigue. Negative for chills and diaphoresis.  HENT: Positive for congestion, ear pain (WIth pressure) and sore throat. Negative for ear discharge, hearing loss, sinus pain and tinnitus.   Eyes: Negative for blurred vision, double vision, pain, discharge and redness.  Respiratory: Positive for cough. Negative for hemoptysis, sputum production, shortness of breath, wheezing and stridor.   Cardiovascular: Negative for chest pain, palpitations and orthopnea.  Gastrointestinal: Negative for abdominal  pain, diarrhea, nausea and vomiting.  Genitourinary: Negative.   Musculoskeletal: Positive for myalgias (Mild, more towards onset of symptoms). Negative for joint pain.  Skin: Positive for itching and rash.  Neurological: Positive for headaches. Negative for dizziness, sensory change and weakness.  Endo/Heme/Allergies: Positive for environmental allergies.  Psychiatric/Behavioral: Negative.   All other systems reviewed and are negative.   Physical Exam:  BP 140/90   Pulse 70   Temp (!) 97.5 F (36.4 C)   Ht 5\' 8"  (1.727 m)   Wt 166 lb (75.3 kg)   SpO2 98%   BMI 25.24 kg/m   General Appearance: Well nourished, in no apparent distress. Eyes: PERRLA, EOMs, conjunctiva no swelling or erythema Sinuses: No Frontal/maxillary tenderness ENT/Mouth: Ext aud canals obstructed by soft wax, unable to remove by loop and in-office irrigation, TMs not visualized. No tenderness over auricle or tragus. Post pharynx somewhat injected.  Tonsils erythematous with some white patches. Hearing normal.  Neck: Supple, thyroid normal.  Respiratory: Respiratory effort normal, BS equal bilaterally without rales, rhonchi, wheezing or stridor.  Cardio: RRR with no MRGs. Brisk peripheral pulses without edema.  Abdomen: Soft, + BS.  Non tender, no guarding, rebound, hernias, masses. Lymphatics: Non tender without lymphadenopathy.  Musculoskeletal: Full ROM, 5/5 strength, normal gait.  Skin: Warm, dry without lesions, ecchymosis. Nonspecific erythematous rash, not raised without texture, pruritic to upper and lower extremities. Scant to torso. Not visible on palms or soles, not notable on face. Nails normal.  Neuro: Cranial nerves intact. Normal muscle tone, no cerebellar symptoms. Sensation intact.  Psych: Awake and oriented X 3, normal affect, Insight and Judgment appropriate.     Izora Ribas, NP 9:37 AM [facility]

## 2017-11-04 NOTE — Patient Instructions (Signed)
Strep Throat Strep throat is a bacterial infection of the throat. Your health care provider may call the infection tonsillitis or pharyngitis, depending on whether there is swelling in the tonsils or at the back of the throat. Strep throat is most common during the cold months of the year in children who are 5-56 years of age, but it can happen during any season in people of any age. This infection is spread from person to person (contagious) through coughing, sneezing, or close contact. What are the causes? Strep throat is caused by the bacteria called Streptococcus pyogenes. What increases the risk? This condition is more likely to develop in:  People who spend time in crowded places where the infection can spread easily.  People who have close contact with someone who has strep throat.  What are the signs or symptoms? Symptoms of this condition include:  Fever or chills.  Redness, swelling, or pain in the tonsils or throat.  Pain or difficulty when swallowing.  White or yellow spots on the tonsils or throat.  Swollen, tender glands in the neck or under the jaw.  Red rash all over the body (rare).  How is this diagnosed? This condition is diagnosed by performing a rapid strep test or by taking a swab of your throat (throat culture test). Results from a rapid strep test are usually ready in a few minutes, but throat culture test results are available after one or two days. How is this treated? This condition is treated with antibiotic medicine. Follow these instructions at home: Medicines  Take over-the-counter and prescription medicines only as told by your health care provider.  Take your antibiotic as told by your health care provider. Do not stop taking the antibiotic even if you start to feel better.  Have family members who also have a sore throat or fever tested for strep throat. They may need antibiotics if they have the strep infection. Eating and drinking  Do not  share food, drinking cups, or personal items that could cause the infection to spread to other people.  If swallowing is difficult, try eating soft foods until your sore throat feels better.  Drink enough fluid to keep your urine clear or pale yellow. General instructions  Gargle with a salt-water mixture 3-4 times per day or as needed. To make a salt-water mixture, completely dissolve -1 tsp of salt in 1 cup of warm water.  Make sure that all household members wash their hands well.  Get plenty of rest.  Stay home from school or work until you have been taking antibiotics for 24 hours.  Keep all follow-up visits as told by your health care provider. This is important. Contact a health care provider if:  The glands in your neck continue to get bigger.  You develop a rash, cough, or earache.  You cough up a thick liquid that is green, yellow-brown, or bloody.  You have pain or discomfort that does not get better with medicine.  Your problems seem to be getting worse rather than better.  You have a fever. Get help right away if:  You have new symptoms, such as vomiting, severe headache, stiff or painful neck, chest pain, or shortness of breath.  You have severe throat pain, drooling, or changes in your voice.  You have swelling of the neck, or the skin on the neck becomes red and tender.  You have signs of dehydration, such as fatigue, dry mouth, and decreased urination.  You become increasingly sleepy, or   you cannot wake up completely.  Your joints become red or painful. This information is not intended to replace advice given to you by your health care provider. Make sure you discuss any questions you have with your health care provider. Document Released: 08/23/2000 Document Revised: 04/24/2016 Document Reviewed: 12/19/2014 Elsevier Interactive Patient Education  2018 Elsevier Inc.  

## 2017-11-05 LAB — BASIC METABOLIC PANEL WITH GFR
BUN: 20 mg/dL (ref 7–25)
CHLORIDE: 101 mmol/L (ref 98–110)
CO2: 27 mmol/L (ref 20–32)
CREATININE: 0.8 mg/dL (ref 0.50–1.05)
Calcium: 9.6 mg/dL (ref 8.6–10.4)
GFR, Est African American: 96 mL/min/{1.73_m2} (ref >=60)
GFR, Est Non African American: 83 mL/min/{1.73_m2} (ref >=60)
GLUCOSE: 86 mg/dL (ref 65–99)
POTASSIUM: 4.9 mmol/L (ref 3.5–5.3)
SODIUM: 138 mmol/L (ref 135–146)

## 2017-11-05 LAB — CBC WITH DIFFERENTIAL/PLATELET
BASOS ABS: 74 {cells}/uL (ref 0–200)
Basophils Relative: 0.8 %
EOS PCT: 3.3 %
Eosinophils Absolute: 307 cells/uL (ref 15–500)
HCT: 42.1 % (ref 35.0–45.0)
Hemoglobin: 14.1 g/dL (ref 11.7–15.5)
Lymphs Abs: 2390 cells/uL (ref 850–3900)
MCH: 29.4 pg (ref 27.0–33.0)
MCHC: 33.5 g/dL (ref 32.0–36.0)
MCV: 87.7 fL (ref 80.0–100.0)
MONOS PCT: 5.7 %
MPV: 10.4 fL (ref 7.5–12.5)
Neutro Abs: 5999 cells/uL (ref 1500–7800)
Neutrophils Relative %: 64.5 %
PLATELETS: 326 10*3/uL (ref 140–400)
RBC: 4.8 10*6/uL (ref 3.80–5.10)
RDW: 12 % (ref 11.0–15.0)
Total Lymphocyte: 25.7 %
WBC mixed population: 530 cells/uL (ref 200–950)
WBC: 9.3 10*3/uL (ref 3.8–10.8)

## 2017-11-05 LAB — URINALYSIS, ROUTINE W REFLEX MICROSCOPIC
BILIRUBIN URINE: NEGATIVE
Bacteria, UA: NONE SEEN /HPF
Glucose, UA: NEGATIVE
HGB URINE DIPSTICK: NEGATIVE
Hyaline Cast: NONE SEEN /LPF
Leukocytes, UA: NEGATIVE
NITRITE: NEGATIVE
PH: 5.5 (ref 5.0–8.0)
SPECIFIC GRAVITY, URINE: 1.036 — AB (ref 1.001–1.03)

## 2017-11-05 LAB — EPSTEIN-BARR VIRUS VCA ANTIBODY PANEL
EBV NA IGG: 397 U/mL — AB
EBV VCA IgG: 447 U/mL — ABNORMAL HIGH
EBV VCA IgM: <36 U/mL

## 2017-11-10 ENCOUNTER — Other Ambulatory Visit: Payer: Self-pay | Admitting: Adult Health

## 2017-11-10 DIAGNOSIS — J029 Acute pharyngitis, unspecified: Secondary | ICD-10-CM

## 2017-11-10 MED ORDER — PREDNISONE 20 MG PO TABS: ORAL_TABLET | ORAL | 0 refills | Status: DC

## 2017-12-17 ENCOUNTER — Ambulatory Visit (INDEPENDENT_AMBULATORY_CARE_PROVIDER_SITE_OTHER): Payer: 59 | Admitting: Internal Medicine

## 2017-12-17 VITALS — BP 114/86 | HR 64 | Temp 97.9°F | Resp 16 | Ht 68.0 in | Wt 166.0 lb

## 2017-12-17 DIAGNOSIS — S8000XA Contusion of unspecified knee, initial encounter: Secondary | ICD-10-CM | POA: Diagnosis not present

## 2017-12-17 MED ORDER — PREDNISONE 20 MG PO TABS: ORAL_TABLET | ORAL | 0 refills | Status: DC

## 2017-12-17 MED ORDER — TRAMADOL HCL 50 MG PO TABS: ORAL_TABLET | ORAL | 0 refills | Status: DC

## 2017-12-17 NOTE — Patient Instructions (Signed)
Contusion A contusion is a deep bruise. Contusions are the result of a blunt injury to tissues and muscle fibers under the skin. The injury causes bleeding under the skin. The skin overlying the contusion may turn blue, purple, or yellow. Minor injuries will give you a painless contusion, but more severe contusions may stay painful and swollen for a few weeks. What are the causes? This condition is usually caused by a blow, trauma, or direct force to an area of the body. What are the signs or symptoms? Symptoms of this condition include:  Swelling of the injured area.  Pain and tenderness in the injured area.  Discoloration. The area may have redness and then turn blue, purple, or yellow.  How is this diagnosed? This condition is diagnosed based on a physical exam and medical history. An X-ray, CT scan, or MRI may be needed to determine if there are any associated injuries, such as broken bones (fractures). How is this treated? Specific treatment for this condition depends on what area of the body was injured. In general, the best treatment for a contusion is resting, icing, applying pressure to (compression), and elevating the injured area. This is often called the RICE strategy. Over-the-counter anti-inflammatory medicines may also be recommended for pain control. Follow these instructions at home:  Rest the injured area.  If directed, apply ice to the injured area: ? Put ice in a plastic bag. ? Place a towel between your skin and the bag. ? Leave the ice on for 20 minutes, 2-3 times per day.  If directed, apply light compression to the injured area using an elastic bandage. Make sure the bandage is not wrapped too tightly. Remove and reapply the bandage as directed by your health care provider.  If possible, raise (elevate) the injured area above the level of your heart while you are sitting or lying down.  Take over-the-counter and prescription medicines only as told by your health  care provider. Contact a health care provider if:  Your symptoms do not improve after several days of treatment.  Your symptoms get worse.  You have difficulty moving the injured area. Get help right away if:  You have severe pain.  You have numbness in a hand or foot.  Your hand or foot turns pale or cold. This information is not intended to replace advice given to you by your health care provider. Make sure you discuss any questions you have with your health care provider. Document Released: 06/05/2005 Document Revised: 01/04/2016 Document Reviewed: 01/11/2015 Elsevier Interactive Patient Education  2018 Elsevier Inc.  

## 2017-12-20 ENCOUNTER — Encounter: Payer: Self-pay | Admitting: Internal Medicine

## 2017-12-20 NOTE — Progress Notes (Signed)
  Subjective:    Patient ID: Jodi Graham, female    DOB: 1961-05-10, 57 y.o.   MRN: [id number]  HPI  Patient is a very nice 57 yo MWF with neg PMH who slipped earlier today - falling forward "catching " with her hands before striking the floor, but she contused both knees w/o immediate pain noted even with ambulation. Several hours layer she developed pain inferior to both knees. Denies any effect on her knee discomfort with standing or walking.   Medication Sig  . VIT D 1000 UNITS  Take 1,000 Units by mouth daily.  Marland Kitchen levothyroxine  125 MCG tablet Take 1/2 tab x 5 days on Mon-Fri and 1 tablet x 2 days on Sat & Sun)   Allergies  Allergen Reactions  . Codeine    Past Medical History:  Diagnosis Date  . DDD (degenerative disc disease)   . Hypertension 2009/2011   labile  . Hypothyroidism    Review of Systems    10 point systems review negative except as above.      Objective:   Physical Exam  BP 114/86   Pulse 64   Temp 97.9 F (36.6 C)   Resp 16   Ht 5' 8.5" (1.74 m)   BMI 24.87 kg/m   HEENT - WNL. Neck - supple.  Chest - Clear equal BS. Cor - Nl HS. RRR w/o sig MGR. Marland Kitchen No edema. MS- FROM w/o deformities.  Gait Nl. Sl tender STS just inferior to the patellae bilat . Not sig STS and No deformity . Knee ROM normal Neuro -  Nl w/o focal abnormalities.    Assessment & Plan:   1. Contusion of knee (s)   - predniSONE (DELTASONE) 20 MG tablet; 1 tab 3 x day for 2 days, then 1 tab 2 x day for 2 days, then 1 tab 1 x day for 3 days  Dispense: 13 tablet  - traMADol (ULTRAM) 50 MG tablet; Take 1/2 to 1 tablet every 4 hours if needed for pain  Dispense: 30 tablet   - Advised f/u if sx's worsen  f or   change .

## 2017-12-30 ENCOUNTER — Ambulatory Visit (HOSPITAL_COMMUNITY)
Admission: RE | Admit: 2017-12-30 | Discharge: 2017-12-30 | Disposition: A | Discharge status: Home / Self Care | Payer: 59 | Source: Ambulatory Visit | Attending: Internal Medicine | Admitting: Internal Medicine

## 2017-12-30 ENCOUNTER — Other Ambulatory Visit: Payer: Self-pay | Admitting: Internal Medicine

## 2017-12-30 DIAGNOSIS — S8002XS Contusion of left knee, sequela: Secondary | ICD-10-CM

## 2017-12-30 DIAGNOSIS — S8001XS Contusion of right knee, sequela: Secondary | ICD-10-CM | POA: Diagnosis present

## 2017-12-30 DIAGNOSIS — S8001XA Contusion of right knee, initial encounter: Secondary | ICD-10-CM | POA: Diagnosis not present

## 2017-12-30 DIAGNOSIS — W19XXXA Unspecified fall, initial encounter: Secondary | ICD-10-CM | POA: Insufficient documentation

## 2017-12-30 DIAGNOSIS — M25561 Pain in right knee: Secondary | ICD-10-CM | POA: Diagnosis not present

## 2018-01-12 NOTE — Progress Notes (Signed)
Complete Physical  Assessment and Plan:  Hypertension, unspecified type - continue medications, DASH diet, exercise and monitor at home. Call if greater than 130/80.  -     CBC with Differential/Platelet -     COMPLETE METABOLIC PANEL WITH GFR -     TSH -     Urinalysis, Routine w reflex microscopic -     Microalbumin / creatinine urine ratio -     EKG 12-Lead  Hypothyroidism, unspecified type Hypothyroidism-check TSH level, continue medications the same, reminded to take on an empty stomach 30-3mins before food.  -     TSH  Mixed hyperlipidemia -continue medications, check lipids, decrease fatty foods, increase activity.  -     Lipid panel  History of prediabetes Monitor weight  Medication management -     Magnesium  Vitamin D deficiency -     VITAMIN D 25 Hydroxy (Vit-D Deficiency, Fractures)  Encounter for general adult medical examination with abnormal findings 1 year  BMI 24.0-24.9, adult Monitor  Screen for colon cancer -     Ambulatory referral to Gastroenterology  Estradiol deficiency Had low bone mass on xray, BMI low and she is post menopausal will get baseline -     DG Bone Density; Future  Conductive hearing loss, bilateral Patient did not do well with cleaning out ears in the office, will refer to ENT -     Ambulatory referral to ENT    Discussed med's effects and SE's. Screening labs and tests as requested with regular follow-up as recommended. Over 40 minutes of exam, counseling, chart review, and complex, high level critical decision making was performed this visit.   HPI  57 y.o. female  presents for a complete physical and follow up for has Hypothyroidism; Vitamin D deficiency; Hypertension; Mixed hyperlipidemia; Prediabetes; and Medication management on their problem list..  Lost her dad, brother in law and has been having stress at work with a big project, has been more sick this year and states she has not been taking care of herself as  well as she does.  She has been having chronic back pain this past year, not walking/working out. She has some neck pain and pain under her left shoulder, sharp. Has seen Dr. Latanya Maudlin in the past. . Patient denies fever, hematuria, incontinence, numbness, tingling, weakness and saddle anesthesia  Her blood pressure has been controlled at home, today their BP is BP: 136/82 She does not workout. She denies chest pain, shortness of breath, dizziness.   She is on cholesterol medication and denies myalgias. Her cholesterol is at goal. The cholesterol last visit was:   Lab Results  Component Value Date   CHOL 230 (H) 01/14/2017   HDL 80 01/14/2017   LDLCALC 129 (H) 01/14/2017   TRIG 107 01/14/2017   CHOLHDL 2.9 01/14/2017   Last A1C in the office was:  Lab Results  Component Value Date   HGBA1C 5.1 01/14/2017   Patient is on Vitamin D supplement.   Lab Results  Component Value Date   VD25OH 37 01/14/2017     She is on thyroid medication. Her medication was changed last visit.   Lab Results  Component Value Date   TSH 1.07 01/14/2017  .  BMI is Body mass index is 24.96 kg/m., she is working on diet and exercise. Wt Readings from Last 3 Encounters:  01/15/18 166 lb 9.6 oz (75.6 kg)  12/17/17 166 lb (75.3 kg)  11/04/17 166 lb (75.3 kg)    Current  Medications:  Current Outpatient Medications on File Prior to Visit  Medication Sig Dispense Refill  . cholecalciferol (VITAMIN D) 1000 UNITS tablet Take 1,000 Units by mouth daily.    Marland Kitchen levothyroxine (SYNTHROID, LEVOTHROID) 125 MCG tablet TAKE ONE TABLET BY MOUTH DAILY FOR THYROID **MUST CALL MD FOR APPOINTMENT FOR MORE REFILLS (Patient taking differently: Take 1/2 tablet by mouth Mon-Fri and 1 tablet on Saturday & Sunday) 90 tablet 3   No current facility-administered medications on file prior to visit.    Allergies:  Allergies  Allergen Reactions  . Codeine    Medical History:  She has Hypothyroidism; Vitamin D deficiency;  Hypertension; Mixed hyperlipidemia; Prediabetes; and Medication management on their problem list.  Health Maintenance:   Immunization History  Administered Date(s) Administered  . PPD Test 08/09/2013, 09/18/2015  . Pneumococcal Polysaccharide-23 07/29/2012  . Td 04/15/2005  . Tdap 09/18/2015   Tetanus: 2017 Pneumovax: 2013 Prevnar 13:  Flu vaccine: Zostavax:  Pap: DUE will go see GYN MGM: 05/2016 States had one this year at [place]: had knee xray that showed low bone mass, she is good BMI, no family history, history of remote smoking, post menopausal x 5 years.  Colonoscopy: DUE EGD: CXR 2011  Patient Care Team: Unk Pinto, MD as PCP - General (Internal Medicine)  Surgical History:  She has no past surgical history on file. Family History:  Herfamily history includes Lymphoma in her father. Social History:  She reports that she has never smoked. She has never used smokeless tobacco. She reports that she drinks about 1.0 oz of alcohol per week. She reports that she does not use drugs.  Review of Systems: Review of Systems  Constitutional: Negative.   HENT: Negative.   Eyes: Negative.   Respiratory: Negative.   Cardiovascular: Negative.   Gastrointestinal: Negative.   Genitourinary: Negative.   Musculoskeletal: Negative.   Skin: Negative.   Neurological: Negative.   Endo/Heme/Allergies: Negative.   Psychiatric/Behavioral: Negative.     Physical Exam: Estimated body mass index is 24.96 kg/m as calculated from the following:   Height as of this encounter: 5' 8.5" (1.74 m).   Weight as of this encounter: 166 lb 9.6 oz (75.6 kg). BP 136/82   Temp 97.7 F (36.5 C)   Resp 16   Ht 5' 8.5" (1.74 m)   Wt 166 lb 9.6 oz (75.6 kg)   BMI 24.96 kg/m  General Appearance: Well nourished, in no apparent distress.  Eyes: PERRLA, EOMs, conjunctiva no swelling or erythema, normal fundi and vessels.  Sinuses: No Frontal/maxillary tenderness  ENT/Mouth: bilateral  ears with cerumen impaction. Good dentition. No erythema, swelling, or exudate on post pharynx. Tonsils not swollen or erythematous. Hearing decreased slightly Neck: Supple, thyroid normal. No bruits  Respiratory: Respiratory effort normal, BS equal bilaterally without rales, rhonchi, wheezing or stridor.  Cardio: RRR without murmurs, rubs or gallops. Brisk peripheral pulses without edema.  Chest: symmetric, with normal excursions and percussion.  Breasts: Symmetric, without lumps, nipple discharge, retractions.  Abdomen: Soft, nontender, no guarding, rebound, hernias, masses, or organomegaly.  Lymphatics: Non tender without lymphadenopathy.  Genitourinary: defer Musculoskeletal: Full ROM all peripheral extremities,5/5 strength, and normal gait.  Skin: left buttocks with 4 mm round, uniform nevus, will monitor. Warm, dry without rashes, lesions, ecchymosis. Neuro: Cranial nerves intact, reflexes equal bilaterally. Normal muscle tone, no cerebellar symptoms. Sensation intact.  Psych: Awake and oriented X 3, normal affect, Insight and Judgment appropriate.   EKG: WNL no ST changes. AORTA SCAN: defer  Vicie Mutters 10:20 AM [facility]

## 2018-01-15 ENCOUNTER — Ambulatory Visit (INDEPENDENT_AMBULATORY_CARE_PROVIDER_SITE_OTHER): Payer: 59 | Admitting: Physician Assistant

## 2018-01-15 ENCOUNTER — Encounter: Payer: Self-pay | Admitting: Physician Assistant

## 2018-01-15 VITALS — BP 136/82 | HR 63 | Temp 97.7°F | Resp 16 | Ht 68.5 in | Wt 166.6 lb

## 2018-01-15 DIAGNOSIS — Z0000 Encounter for general adult medical examination without abnormal findings: Secondary | ICD-10-CM

## 2018-01-15 DIAGNOSIS — E039 Hypothyroidism, unspecified: Secondary | ICD-10-CM

## 2018-01-15 DIAGNOSIS — Z136 Encounter for screening for cardiovascular disorders: Secondary | ICD-10-CM

## 2018-01-15 DIAGNOSIS — E782 Mixed hyperlipidemia: Secondary | ICD-10-CM | POA: Diagnosis not present

## 2018-01-15 DIAGNOSIS — E348 Other specified endocrine disorders: Secondary | ICD-10-CM

## 2018-01-15 DIAGNOSIS — Z87898 Personal history of other specified conditions: Secondary | ICD-10-CM

## 2018-01-15 DIAGNOSIS — Z1211 Encounter for screening for malignant neoplasm of colon: Secondary | ICD-10-CM

## 2018-01-15 DIAGNOSIS — Z0001 Encounter for general adult medical examination with abnormal findings: Secondary | ICD-10-CM

## 2018-01-15 DIAGNOSIS — I10 Essential (primary) hypertension: Secondary | ICD-10-CM | POA: Diagnosis not present

## 2018-01-15 DIAGNOSIS — Z79899 Other long term (current) drug therapy: Secondary | ICD-10-CM

## 2018-01-15 DIAGNOSIS — H900 Conductive hearing loss, bilateral: Secondary | ICD-10-CM

## 2018-01-15 DIAGNOSIS — Z6824 Body mass index (BMI) 24.0-24.9, adult: Secondary | ICD-10-CM

## 2018-01-15 DIAGNOSIS — E559 Vitamin D deficiency, unspecified: Secondary | ICD-10-CM

## 2018-01-15 NOTE — Progress Notes (Signed)
Request has been faxed to [facility] on May 9th 2019 by DD

## 2018-01-15 NOTE — Patient Instructions (Signed)
Use a dropper or use a cap to put peroxide, olive oil,mineral oil or canola oil in the effected ear- 2-3 times a week. Let it soak for 20-30 min then you can take a shower or use a baby bulb with warm water to wash out the ear wax.  Do not use Qtips  We have made a referral for you to get imaging or go to another doctor. We will try to get this as soon as possible but please understand that we often need to get approval with your insurance before we can schedule and not every office can accommodate you quickly. So please allow 5-7 business days for [place] to call you back about the referral.     Mole A mole is a colored (pigmented) growth on the skin. Moles are very common. They are usually harmless, but some moles can become cancerous over time. What are the causes? Moles occur when pigmented skin cells grow together in clusters instead of spreading out in the skin as they normally do. The reason why the skin cells grow together in clusters is not known. What are the signs or symptoms? A mole may be:  [place] or black.  Flat or raised.  Smooth or wrinkled.  How is this diagnosed? A mole is diagnosed with a skin exam. If your health care provider thinks a mole may be cancerous, a piece of the mole will be removed for testing. How is this treated? Treatment is not needed unless a mole is cancerous. If a mole is cancerous, it will be removed. If a mole is causing pain or you do not like the way it looks, you may choose to have it removed. Follow these instructions at home:  Every month, look for new moles and check your existing moles for changes. This is important because a change in a mole can mean that the mole has become cancerous. Look for changes in: ? Size. Look for moles that are more than  in (0.64 cm) wide (in diameter). ? Shape. Look for moles that are not round or oval. ? Borders. Look for moles that are not symmetrical. ? Color. Note that it is normal for moles to get darker  during pregnancy or when you take birth control pills.  When you are outdoors, wear sunscreen with SPF 30 (sun protection factor 30) or higher. Reapply the sunscreen every 2-3 hours.  If you have a large number of moles, see a skin doctor (dermatologist) at least one time every year. Contact a health care provider if:  The size, shape, borders, or color of your mole change.  Your mole, or the skin near the mole, becomes painful, sore, red, or swollen.  Your mole: ? Develops more than one color. ? Itches or bleeds. ? Becomes scaly, sheds skin, or oozes fluid. ? Becomes flat or develops raised areas. ? Becomes hard or soft.  You develop a new mole. This information is not intended to replace advice given to you by your health care provider. Make sure you discuss any questions you have with your health care provider. Document Released: 05/21/2001 Document Revised: 02/07/2016 Document Reviewed: 06/16/2015 Elsevier Interactive Patient Education  [employer].

## 2018-01-16 LAB — CBC WITH DIFFERENTIAL/PLATELET
BASOS ABS: 76 {cells}/uL (ref 0–200)
Basophils Relative: 1 %
EOS ABS: 608 {cells}/uL — AB (ref 15–500)
Eosinophils Relative: 8 %
HCT: 38.6 % (ref 35.0–45.0)
Hemoglobin: 13.1 g/dL (ref 11.7–15.5)
Lymphs Abs: 2409 cells/uL (ref 850–3900)
MCH: 29.8 pg (ref 27.0–33.0)
MCHC: 33.9 g/dL (ref 32.0–36.0)
MCV: 87.9 fL (ref 80.0–100.0)
MONOS PCT: 6.5 %
MPV: 10.5 fL (ref 7.5–12.5)
NEUTROS PCT: 52.8 %
Neutro Abs: 4013 cells/uL (ref 1500–7800)
PLATELETS: 289 10*3/uL (ref 140–400)
RBC: 4.39 10*6/uL (ref 3.80–5.10)
RDW: 12.5 % (ref 11.0–15.0)
TOTAL LYMPHOCYTE: 31.7 %
WBC mixed population: 494 cells/uL (ref 200–950)
WBC: 7.6 10*3/uL (ref 3.8–10.8)

## 2018-01-16 LAB — COMPLETE METABOLIC PANEL WITH GFR
AG Ratio: 1.9 (calc) (ref 1.0–2.5)
ALT: 19 U/L (ref 6–29)
AST: 21 U/L (ref 10–35)
Albumin: 4.7 g/dL (ref 3.6–5.1)
Alkaline phosphatase (APISO): 102 U/L (ref 33–130)
BILIRUBIN TOTAL: 1.1 mg/dL (ref 0.2–1.2)
BUN: 16 mg/dL (ref 7–25)
CHLORIDE: 102 mmol/L (ref 98–110)
CO2: 30 mmol/L (ref 20–32)
Calcium: 9.5 mg/dL (ref 8.6–10.4)
Creat: 0.7 mg/dL (ref 0.50–1.05)
GFR, Est African American: 113 mL/min/{1.73_m2} (ref >=60)
GFR, Est Non African American: 98 mL/min/{1.73_m2} (ref >=60)
GLUCOSE: 84 mg/dL (ref 65–99)
Globulin: 2.5 g/dL (calc) (ref 1.9–3.7)
POTASSIUM: 3.9 mmol/L (ref 3.5–5.3)
Sodium: 139 mmol/L (ref 135–146)
Total Protein: 7.2 g/dL (ref 6.1–8.1)

## 2018-01-16 LAB — MICROALBUMIN / CREATININE URINE RATIO
Creatinine, Urine: 70 mg/dL (ref 20–275)
MICROALB UR: 0.3 mg/dL
Microalb Creat Ratio: 4 mcg/mg creat (ref <30)

## 2018-01-16 LAB — URINALYSIS, ROUTINE W REFLEX MICROSCOPIC
Bilirubin Urine: NEGATIVE
GLUCOSE, UA: NEGATIVE
HGB URINE DIPSTICK: NEGATIVE
Ketones, ur: NEGATIVE
LEUKOCYTES UA: NEGATIVE
NITRITE: NEGATIVE
Protein, ur: NEGATIVE
Specific Gravity, Urine: 1.012 (ref 1.001–1.03)
pH: <=5 (ref 5.0–8.0)

## 2018-01-16 LAB — LIPID PANEL
Cholesterol: 222 mg/dL — ABNORMAL HIGH (ref <200)
HDL: 72 mg/dL (ref >50)
LDL Cholesterol (Calc): 132 mg/dL (calc) — ABNORMAL HIGH
Non-HDL Cholesterol (Calc): 150 mg/dL (calc) — ABNORMAL HIGH (ref <130)
TRIGLYCERIDES: 85 mg/dL (ref <150)
Total CHOL/HDL Ratio: 3.1 (calc) (ref <5.0)

## 2018-01-16 LAB — MAGNESIUM: MAGNESIUM: 2.1 mg/dL (ref 1.5–2.5)

## 2018-01-16 LAB — TSH: TSH: 2.67 m[IU]/L

## 2018-01-16 LAB — VITAMIN D 25 HYDROXY (VIT D DEFICIENCY, FRACTURES): VIT D 25 HYDROXY: 48 ng/mL (ref 30–100)

## 2018-01-24 ENCOUNTER — Other Ambulatory Visit: Payer: Self-pay | Admitting: Physician Assistant

## 2018-01-29 DIAGNOSIS — H6123 Impacted cerumen, bilateral: Secondary | ICD-10-CM | POA: Diagnosis not present

## 2018-02-03 ENCOUNTER — Encounter: Payer: Self-pay | Admitting: Gastroenterology

## 2018-02-09 DIAGNOSIS — M810 Age-related osteoporosis without current pathological fracture: Secondary | ICD-10-CM | POA: Diagnosis not present

## 2018-02-09 DIAGNOSIS — M8589 Other specified disorders of bone density and structure, multiple sites: Secondary | ICD-10-CM | POA: Diagnosis not present

## 2018-02-09 LAB — HM DEXA SCAN

## 2018-02-10 ENCOUNTER — Other Ambulatory Visit: Payer: Self-pay | Admitting: Internal Medicine

## 2018-02-10 ENCOUNTER — Encounter: Payer: Self-pay | Admitting: *Deleted

## 2018-02-10 MED ORDER — ALENDRONATE SODIUM 70 MG PO TABS: ORAL_TABLET | ORAL | 3 refills | Status: DC

## 2018-02-10 NOTE — Progress Notes (Signed)
  [facility]   - dexa BMD shows T  -2.5 in Rt Hip   - Rx Fosamax 70 mg tab #12 x 3 rs - take 1 tablet weekly ([place])

## 2018-02-11 ENCOUNTER — Encounter (INDEPENDENT_AMBULATORY_CARE_PROVIDER_SITE_OTHER): Payer: Self-pay

## 2018-03-30 ENCOUNTER — Ambulatory Visit (AMBULATORY_SURGERY_CENTER): Payer: Self-pay | Admitting: *Deleted

## 2018-03-30 VITALS — Ht 68.5 in | Wt 166.8 lb

## 2018-03-30 DIAGNOSIS — Z1211 Encounter for screening for malignant neoplasm of colon: Secondary | ICD-10-CM

## 2018-03-30 MED ORDER — PEG-KCL-NACL-NASULF-NA ASC-C 140 G PO SOLR: 1.0000 | ORAL | 0 refills | Status: DC

## 2018-03-30 NOTE — Progress Notes (Signed)
No egg or soy allergy known to patient  No issues with past sedation with any surgeries  or procedures, no past intubation  No diet pills per patient No home 02 use per patient  No blood thinners per patient  Pt denies issues with constipation  No A fib or A flutter  EMMI video sent to pt's e mail  Plenvu universal coupon to pt in [place] today -

## 2018-04-20 ENCOUNTER — Encounter: Payer: Self-pay | Admitting: Gastroenterology

## 2018-04-20 ENCOUNTER — Ambulatory Visit (AMBULATORY_SURGERY_CENTER): Payer: 59 | Admitting: Gastroenterology

## 2018-04-20 VITALS — BP 101/59 | HR 58 | Temp 97.3°F | Resp 10 | Ht 68.0 in | Wt 166.0 lb

## 2018-04-20 DIAGNOSIS — Z1211 Encounter for screening for malignant neoplasm of colon: Secondary | ICD-10-CM | POA: Diagnosis not present

## 2018-04-20 DIAGNOSIS — D123 Benign neoplasm of transverse colon: Secondary | ICD-10-CM

## 2018-04-20 MED ORDER — SODIUM CHLORIDE 0.9 % IV SOLN: 500.0000 mL | Freq: Once | INTRAVENOUS | Status: DC

## 2018-04-20 NOTE — Progress Notes (Signed)
Pt's states no medical or surgical changes since previsit or office visit. 

## 2018-04-20 NOTE — Patient Instructions (Signed)
Handout given on polyps  YOU HAD AN ENDOSCOPIC PROCEDURE TODAY AT THE [facility]:   Refer to the procedure report that was given to you for any specific questions about what was found during the examination.  If the procedure report does not answer your questions, please call your gastroenterologist to clarify.  If you requested that your care partner not be given the details of your procedure findings, then the procedure report has been included in a sealed envelope for you to review at your convenience later.  YOU SHOULD EXPECT: Some feelings of bloating in the abdomen. Passage of more gas than usual.  Walking can help get rid of the air that was put into your GI tract during the procedure and reduce the bloating. If you had a lower endoscopy (such as a colonoscopy or flexible sigmoidoscopy) you may notice spotting of blood in your stool or on the toilet paper. If you underwent a bowel prep for your procedure, you may not have a normal bowel movement for a few days.  Please Note:  You might notice some irritation and congestion in your nose or some drainage.  This is from the oxygen used during your procedure.  There is no need for concern and it should clear up in a day or so.  SYMPTOMS TO REPORT IMMEDIATELY:   Following lower endoscopy (colonoscopy or flexible sigmoidoscopy):  Excessive amounts of blood in the stool  Significant tenderness or worsening of abdominal pains  Swelling of the abdomen that is new, acute  Fever of 100F or higher    For urgent or emergent issues, a gastroenterologist can be reached at any hour by calling [phone].   DIET:  We do recommend a small meal at first, but then you may proceed to your regular diet.  Drink plenty of fluids but you should avoid alcoholic beverages for 24 hours.  ACTIVITY:  You should plan to take it easy for the rest of today and you should NOT DRIVE or use heavy machinery until tomorrow (because of the sedation  medicines used during the test).    FOLLOW UP: Our staff will call the number listed on your records the next business day following your procedure to check on you and address any questions or concerns that you may have regarding the information given to you following your procedure. If we do not reach you, we will leave a message.  However, if you are feeling well and you are not experiencing any problems, there is no need to return our call.  We will assume that you have returned to your regular daily activities without incident.  If any biopsies were taken you will be contacted by phone or by letter within the next 1-3 weeks.  Please call us at [phone] if you have not heard about the biopsies in 3 weeks.    SIGNATURES/CONFIDENTIALITY: You and/or your care partner have signed paperwork which will be entered into your electronic medical record.  These signatures attest to the fact that that the information above on your After Visit Summary has been reviewed and is understood.  Full responsibility of the confidentiality of this discharge information lies with you and/or your care-partner. 

## 2018-04-20 NOTE — Progress Notes (Signed)
To PACU, VSS. Report to [email or web address] 

## 2018-04-20 NOTE — Op Note (Signed)
[facility] Patient Name: Jodi Graham Procedure Date: 04/20/2018 11:31 AM MRN: [id number] Endoscopist: [place]. Loletha Carrow , MD Age: 57 Referring MD:  Date of Birth: December 21, 1960 Gender: Female Account #: [id number] Procedure:                Colonoscopy Indications:              Screening for colorectal malignant neoplasm, This                            is the patient's first colonoscopy Medicines:                Monitored Anesthesia Care Procedure:                Pre-Anesthesia Assessment:                           - Prior to the procedure, a History and Physical                            was performed, and patient medications and                            allergies were reviewed. The patient's tolerance of                            previous anesthesia was also reviewed. The risks                            and benefits of the procedure and the sedation                            options and risks were discussed with the patient.                            All questions were answered, and informed consent                            was obtained. Prior Anticoagulants: The patient has                            taken no previous anticoagulant or antiplatelet                            agents. ASA Grade Assessment: II - A patient with                            mild systemic disease. After reviewing the risks                            and benefits, the patient was deemed in                            satisfactory condition to undergo the procedure.  After obtaining informed consent, the colonoscope                            was passed under direct vision. Throughout the                            procedure, the patient's blood pressure, pulse, and                            oxygen saturations were monitored continuously. The                            Model PCF-[id number]) scope was introduced                            through the anus and  advanced to the the cecum,                            identified by appendiceal orifice and ileocecal                            valve. The colonoscopy was performed without                            difficulty. The patient tolerated the procedure                            well. The quality of the bowel preparation was                            excellent. The ileocecal valve, appendiceal                            orifice, and rectum were photographed. Scope In: 11:47:39 AM Scope Out: 12:01:59 PM Scope Withdrawal Time: 0 hours 10 minutes 39 seconds  Total Procedure Duration: 0 hours 14 minutes 20 seconds  Findings:                 The perianal and digital rectal examinations were                            normal.                           A 3 mm polyp was found in the distal transverse                            colon. The polyp was sessile. The polyp was removed                            with a cold snare. Resection and retrieval were                            complete.  The exam was otherwise without abnormality on                            direct and retroflexion views. Complications:            No immediate complications. Estimated Blood Loss:     Estimated blood loss was minimal. Impression:               - One 3 mm polyp in the distal transverse colon,                            removed with a cold snare. Resected and retrieved.                           - The examination was otherwise normal on direct                            and retroflexion views. Recommendation:           - Patient has a contact number available for                            emergencies. The signs and symptoms of potential                            delayed complications were discussed with the                            patient. Return to normal activities tomorrow.                            Written discharge instructions were provided to the                             patient.                           - Resume previous diet.                           - Continue present medications.                           - Await pathology results.                           - Repeat colonoscopy is recommended for                            surveillance. The colonoscopy date will be                            determined after pathology results from today's                            exam become available for review. Henry L. Loletha Carrow, MD 04/20/2018 12:04:33 PM This report  has been signed electronically.

## 2018-04-20 NOTE — Progress Notes (Signed)
Called to room to assist during endoscopic procedure.  Patient ID and intended procedure confirmed with present staff. Received instructions for my participation in the procedure from the performing physician.  

## 2018-04-21 ENCOUNTER — Telehealth: Payer: Self-pay

## 2018-04-21 NOTE — Telephone Encounter (Signed)
  Follow up Call-  Call Torre Pikus number 04/20/2018  Post procedure Call Callaghan Laverdure phone  # [phone]  Permission to leave phone message Yes  Some recent data might be hidden     Patient questions:  Do you have a fever, pain , or abdominal swelling? No. Pain Score  0 *  Have you tolerated food without any problems? Yes.    Have you been able to return to your normal activities? Yes.    Do you have any questions about your discharge instructions: Diet   No. Medications  No. Follow up visit  No.  Do you have questions or concerns about your Care? No.  Actions: * If pain score is 4 or above: No action needed, pain <4.

## 2018-04-21 NOTE — Telephone Encounter (Signed)
  Follow up Call-  Call back number 04/20/2018  Post procedure Call Back phone  # [phone]  Permission to leave phone message Yes  Some recent data might be hidden     No ID on answering machine.  No message left. [name]/Call-back

## 2018-04-24 ENCOUNTER — Encounter: Payer: Self-pay | Admitting: Gastroenterology

## 2018-05-10 ENCOUNTER — Other Ambulatory Visit: Payer: Self-pay | Admitting: Internal Medicine

## 2018-07-10 DIAGNOSIS — Z23 Encounter for immunization: Secondary | ICD-10-CM | POA: Diagnosis not present

## 2018-08-14 ENCOUNTER — Other Ambulatory Visit: Payer: Self-pay | Admitting: Internal Medicine

## 2018-08-17 IMAGING — CR DG KNEE COMPLETE 4+V*L*
4 series · 4 of 4 positions shown · non-contrast
Comparison: None

CLINICAL DATA: Fell 2 weeks ago, bruising and soft tissue swelling
in both knees, RIGHT knee pain

EXAM:
LEFT KNEE - COMPLETE 4+ VIEW

[knee ap]
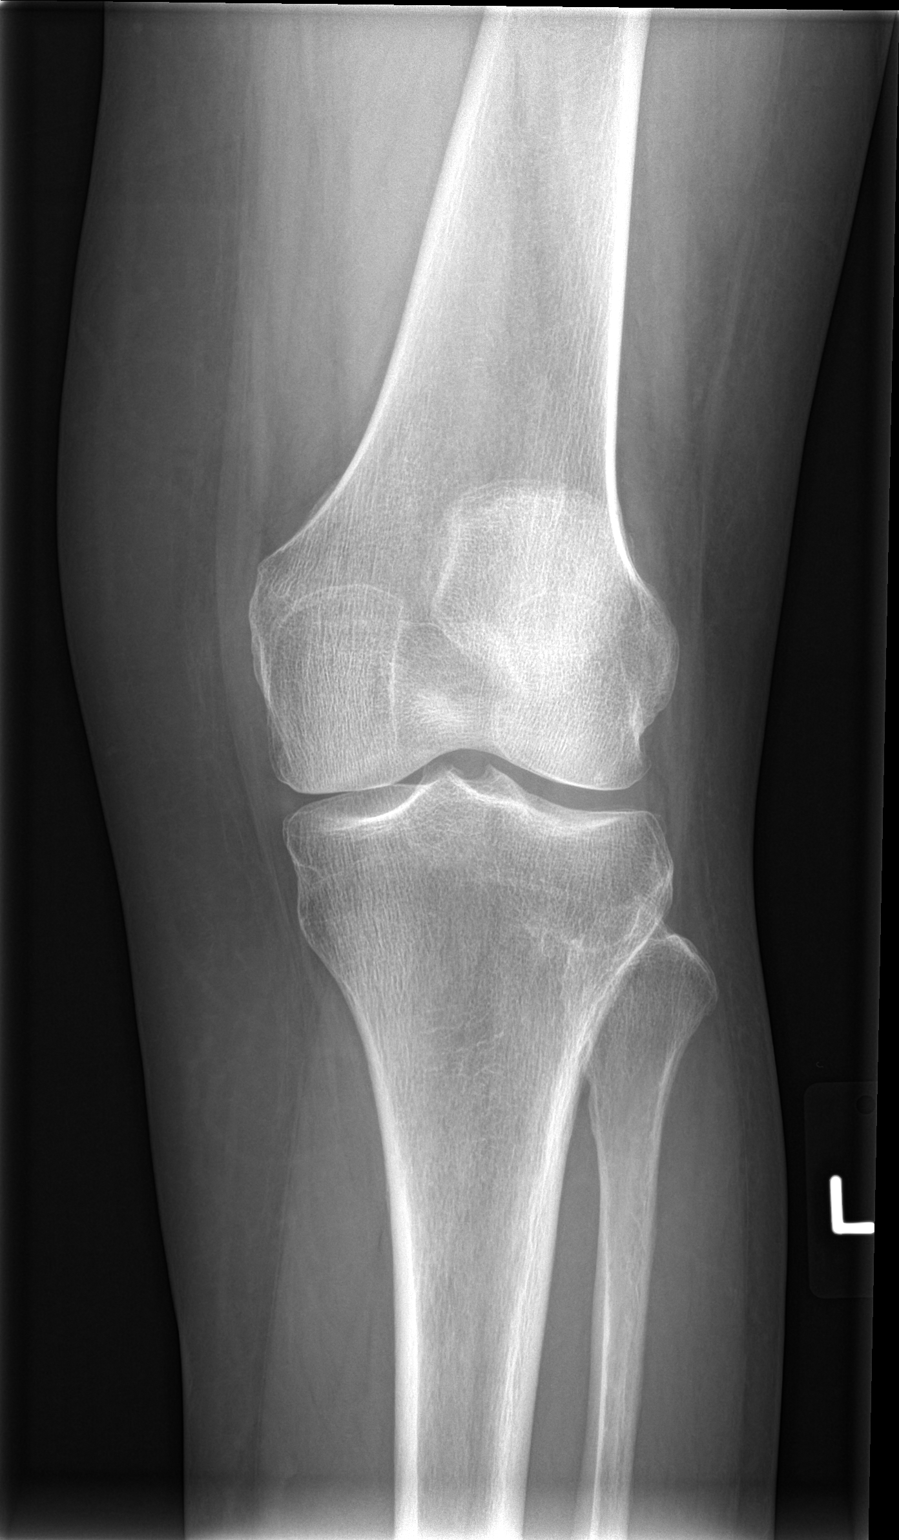

[knee lat]
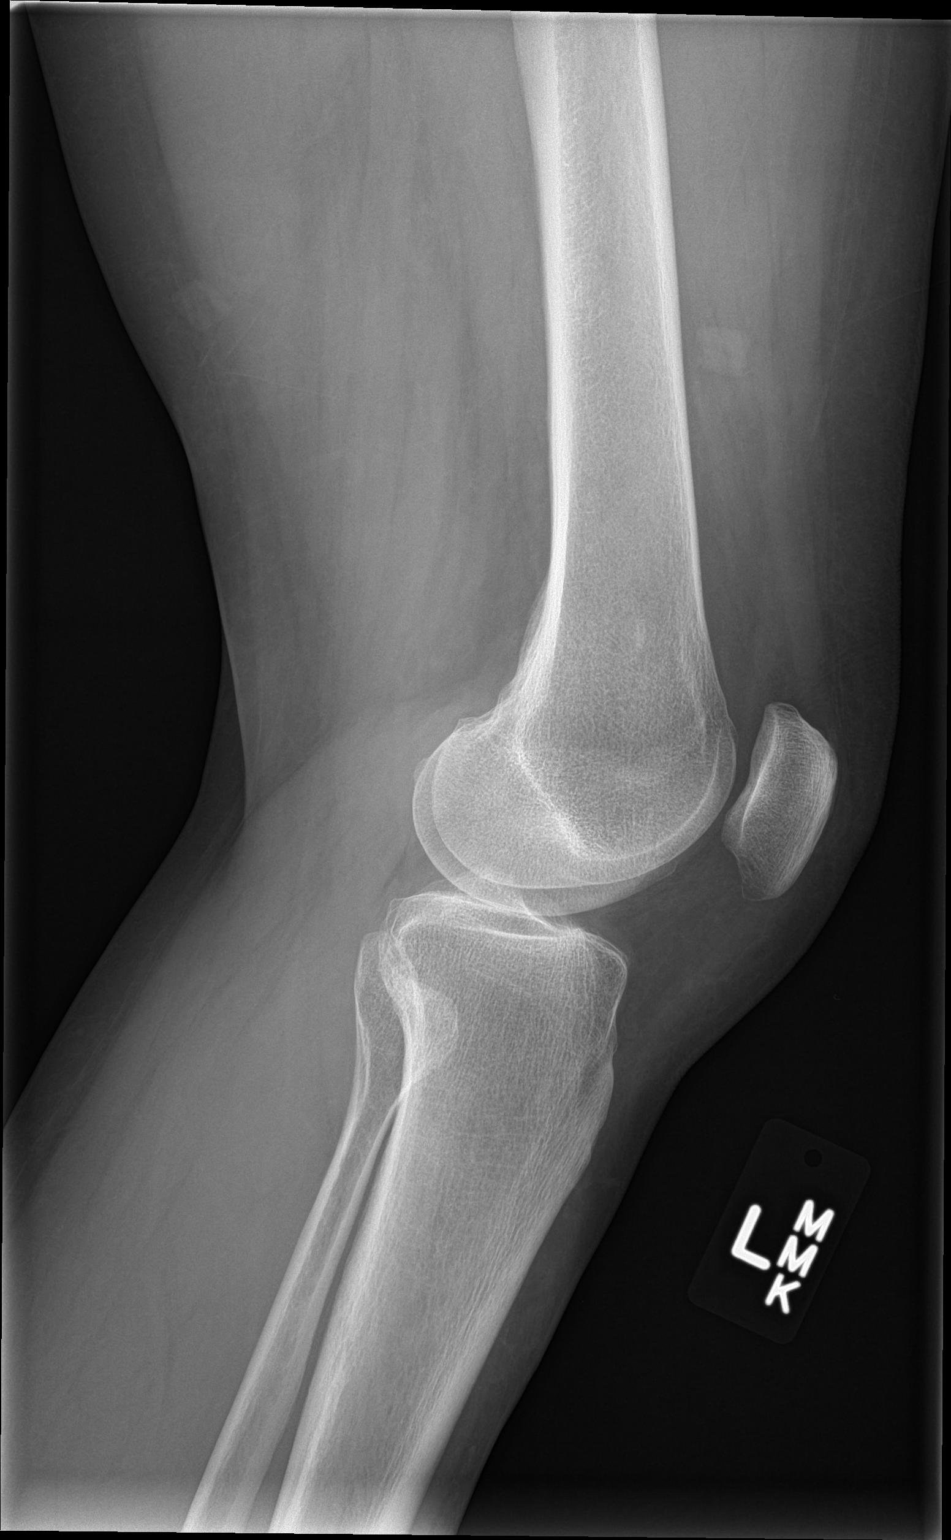

[knee obl (1 of 2)]
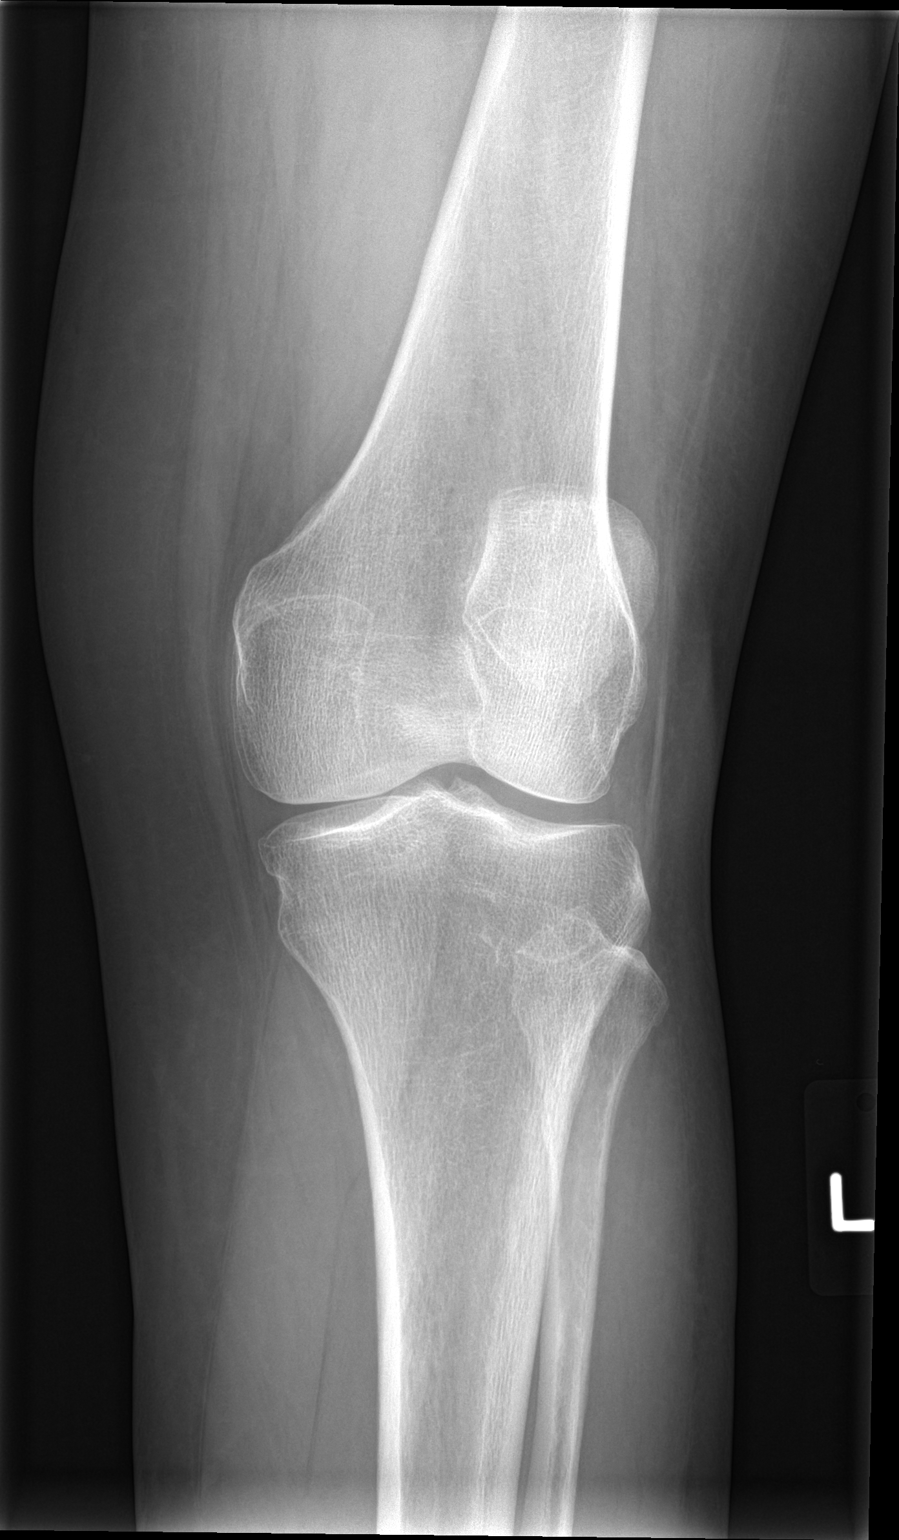

[knee obl (2 of 2)]
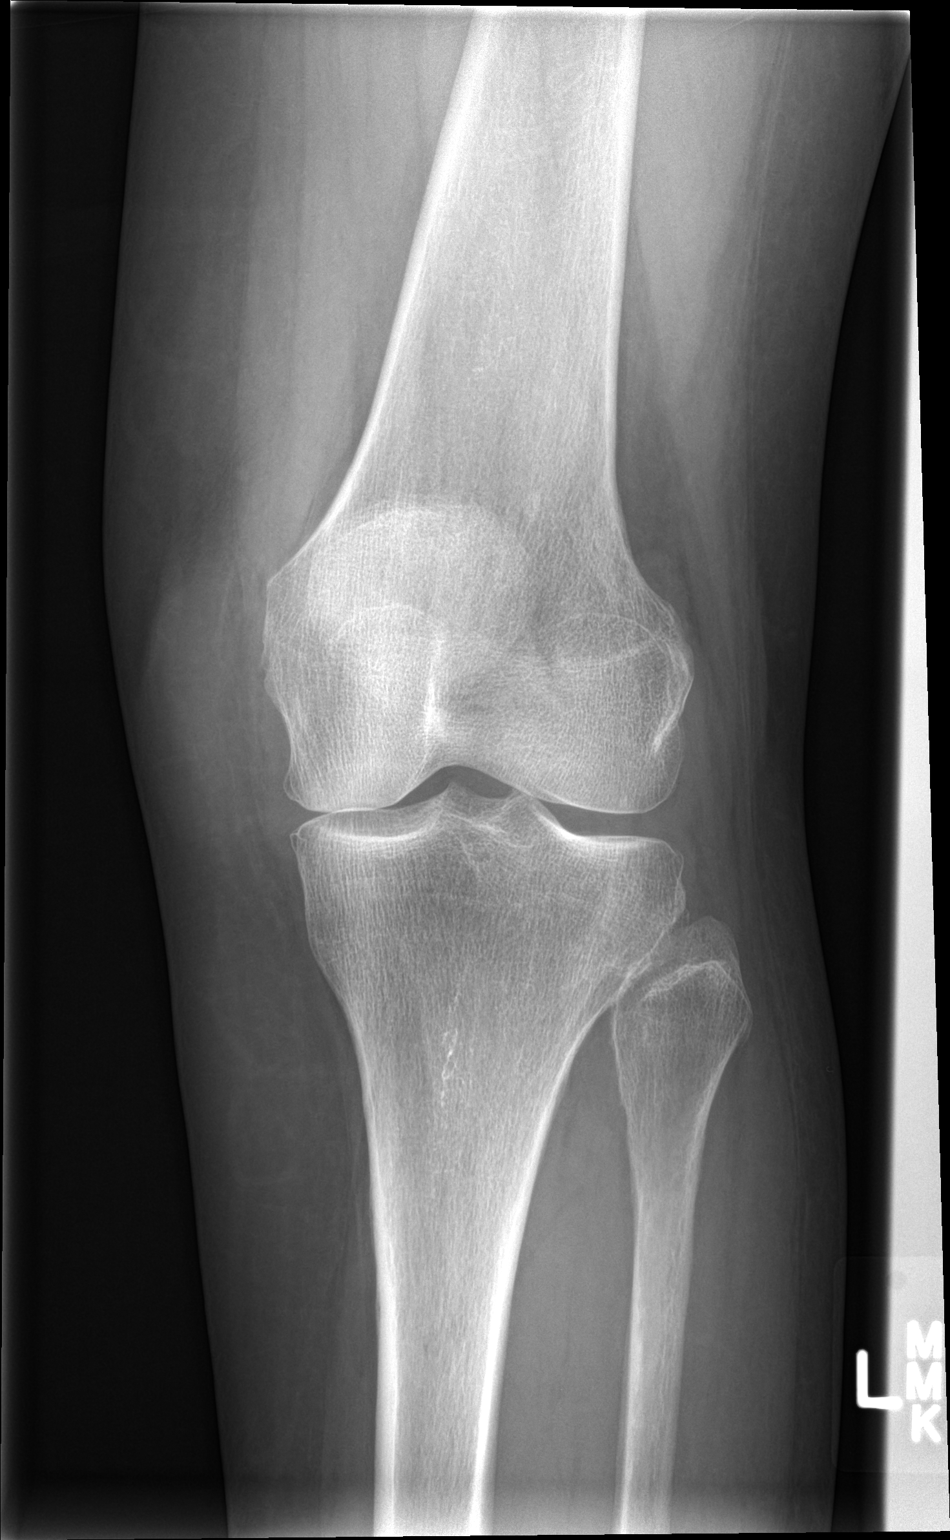

[4 of 4 positions shown; findings below may reference images not displayed]

FINDINGS: Borderline low osseous mineralization.

Joint spaces preserved.

No acute fracture, dislocation, or bone destruction.

No knee joint effusion.
IMPRESSION: No acute osseous abnormalities.

## 2018-08-21 DIAGNOSIS — Z23 Encounter for immunization: Secondary | ICD-10-CM | POA: Diagnosis not present

## 2018-10-07 DIAGNOSIS — Z1231 Encounter for screening mammogram for malignant neoplasm of breast: Secondary | ICD-10-CM | POA: Diagnosis not present

## 2018-10-07 LAB — HM MAMMOGRAPHY

## 2018-10-26 ENCOUNTER — Encounter: Payer: Self-pay | Admitting: Internal Medicine

## 2018-11-06 DIAGNOSIS — Z23 Encounter for immunization: Secondary | ICD-10-CM | POA: Diagnosis not present

## 2018-11-25 DIAGNOSIS — M4722 Other spondylosis with radiculopathy, cervical region: Secondary | ICD-10-CM | POA: Diagnosis not present

## 2018-11-28 ENCOUNTER — Other Ambulatory Visit: Payer: Self-pay | Admitting: Adult Health

## 2018-12-08 ENCOUNTER — Other Ambulatory Visit: Payer: Self-pay | Admitting: *Deleted

## 2018-12-08 MED ORDER — ALENDRONATE SODIUM 70 MG PO TABS: ORAL_TABLET | ORAL | 3 refills | Status: DC

## 2018-12-27 ENCOUNTER — Other Ambulatory Visit: Payer: Self-pay | Admitting: Adult Health

## 2019-01-18 NOTE — Progress Notes (Deleted)
Complete Physical  Assessment and Plan:  Hypertension, unspecified type - continue medications, DASH diet, exercise and monitor at home. Call if greater than 130/80.  -     CBC with Differential/Platelet -     COMPLETE METABOLIC PANEL WITH GFR -     TSH -     Urinalysis, Routine w reflex microscopic -     Microalbumin / creatinine urine ratio -     EKG 12-Lead  Hypothyroidism, unspecified type Hypothyroidism-check TSH level, continue medications the same, reminded to take on an empty stomach 30-70mins before food.  -     TSH  Mixed hyperlipidemia -continue medications, check lipids, decrease fatty foods, increase activity.  -     Lipid panel  History of prediabetes Monitor weight  Medication management -     Magnesium  Vitamin D deficiency -     VITAMIN D 25 Hydroxy (Vit-D Deficiency, Fractures)  Encounter for general adult medical examination with abnormal findings 1 year  BMI 24.0-24.9, adult Monitor  Screen for colon cancer -     Ambulatory referral to Gastroenterology  Estradiol deficiency Had low bone mass on xray, BMI low and she is post menopausal will get baseline -     DG Bone Density; Future  Conductive hearing loss, bilateral Patient did not do well with cleaning out ears in the office, will refer to ENT -     Ambulatory referral to ENT    Discussed med's effects and SE's. Screening labs and tests as requested with regular follow-up as recommended. Over 40 minutes of exam, counseling, chart review, and complex, high level critical decision making was performed this visit.   HPI  58 y.o. female  presents for a complete physical and follow up for has Hypothyroidism; Vitamin D deficiency; Hypertension; Mixed hyperlipidemia; History of prediabetes; and Medication management on their problem list..  Lost her dad, brother in law and has been having stress at work with a big project, has been more sick this year and states she has not been taking care of  herself as well as she does.  She has been having chronic back pain this past year, not walking/working out. She has some neck pain and pain under her left shoulder, sharp. Has seen Dr. Latanya Maudlin in the past. . Patient denies fever, hematuria, incontinence, numbness, tingling, weakness and saddle anesthesia  Her blood pressure has been controlled at home, today their BP is   She does not workout. She denies chest pain, shortness of breath, dizziness.   She is on cholesterol medication and denies myalgias. Her cholesterol is at goal. The cholesterol last visit was:   Lab Results  Component Value Date   CHOL 222 (H) 01/15/2018   HDL 72 01/15/2018   LDLCALC 132 (H) 01/15/2018   TRIG 85 01/15/2018   CHOLHDL 3.1 01/15/2018   Last A1C in the office was:  Lab Results  Component Value Date   HGBA1C 5.1 01/14/2017   Patient is on Vitamin D supplement.   Lab Results  Component Value Date   VD25OH 48 01/15/2018     She is on thyroid medication. Her medication was changed last visit.   Lab Results  Component Value Date   TSH 2.67 01/15/2018  .  BMI is There is no height or weight on file to calculate BMI., she is working on diet and exercise. Wt Readings from Last 3 Encounters:  04/20/18 166 lb (75.3 kg)  03/30/18 166 lb 12.8 oz (75.7 kg)  01/15/18 166  lb 9.6 oz (75.6 kg)    Current Medications:  Current Outpatient Medications on File Prior to Visit  Medication Sig Dispense Refill  . alendronate (FOSAMAX) 70 MG tablet Take 1 tablet /week (every 7 days) 1st in the morning with 8 oz of water on an empty stomach for 30-60 minutes 12 tablet 3  . cholecalciferol (VITAMIN D) 1000 UNITS tablet Take 1,000 Units by mouth daily.    Marland Kitchen levothyroxine (SYNTHROID) 125 MCG tablet TAKE ONE TABLET BY MOUTH DAILY ON AN EMPTY STOMACH WITH FULL GLASS OF WATER 30 tablet 0   No current facility-administered medications on file prior to visit.    Allergies:  Allergies  Allergen Reactions  . Codeine Nausea  And Vomiting   Medical History:  She has Hypothyroidism; Vitamin D deficiency; Hypertension; Mixed hyperlipidemia; History of prediabetes; and Medication management on their problem list.  Health Maintenance:   Immunization History  Administered Date(s) Administered  . PPD Test 08/09/2013, 09/18/2015  . Pneumococcal Polysaccharide-23 07/29/2012  . Td 04/15/2005  . Tdap 09/18/2015, 11/06/2018  . Zoster Recombinat (Shingrix) 08/21/2018, 11/06/2018   Tetanus: 2017 Pneumovax: 2013 Prevnar 13:  Flu vaccine: Zostavax:  Pap: DUE will go see GYN MGM: 05/2016 States had one this year at [place]: had knee xray that showed low bone mass, she is good BMI, no family history, history of remote smoking, post menopausal x 5 years.  Colonoscopy: DUE EGD: CXR 2011  Patient Care Team: Unk Pinto, MD as PCP - General (Internal Medicine)  Surgical History:  She has a past surgical history that includes Biopsy thyroid (2009) and Wisdom tooth extraction. Family History:  Herfamily history includes Cancer (age of onset: 48) in her paternal uncle; Dementia in her mother; Lymphoma (age of onset: 75) in her father; Lymphoma (age of onset: 41) in her paternal uncle. Social History:  She reports that she has quit smoking. She has never used smokeless tobacco. She reports current alcohol use of about 2.0 standard drinks of alcohol per week. She reports that she does not use drugs.  Review of Systems: Review of Systems  Constitutional: Negative.   HENT: Negative.   Eyes: Negative.   Respiratory: Negative.   Cardiovascular: Negative.   Gastrointestinal: Negative.   Genitourinary: Negative.   Musculoskeletal: Negative.   Skin: Negative.   Neurological: Negative.   Endo/Heme/Allergies: Negative.   Psychiatric/Behavioral: Negative.     Physical Exam: Estimated body mass index is 25.24 kg/m as calculated from the following:   Height as of 04/20/18: 5\' 8"  (1.727 m).   Weight as of  04/20/18: 166 lb (75.3 kg). There were no vitals taken for this visit. General Appearance: Well nourished, in no apparent distress.  Eyes: PERRLA, EOMs, conjunctiva no swelling or erythema, normal fundi and vessels.  Sinuses: No Frontal/maxillary tenderness  ENT/Mouth: bilateral ears with cerumen impaction. Good dentition. No erythema, swelling, or exudate on post pharynx. Tonsils not swollen or erythematous. Hearing decreased slightly Neck: Supple, thyroid normal. No bruits  Respiratory: Respiratory effort normal, BS equal bilaterally without rales, rhonchi, wheezing or stridor.  Cardio: RRR without murmurs, rubs or gallops. Brisk peripheral pulses without edema.  Chest: symmetric, with normal excursions and percussion.  Breasts: Symmetric, without lumps, nipple discharge, retractions.  Abdomen: Soft, nontender, no guarding, rebound, hernias, masses, or organomegaly.  Lymphatics: Non tender without lymphadenopathy.  Genitourinary: defer Musculoskeletal: Full ROM all peripheral extremities,5/5 strength, and normal gait.  Skin: left buttocks with 4 mm round, uniform nevus, will monitor. Warm, dry without rashes, lesions,  ecchymosis. Neuro: Cranial nerves intact, reflexes equal bilaterally. Normal muscle tone, no cerebellar symptoms. Sensation intact.  Psych: Awake and oriented X 3, normal affect, Insight and Judgment appropriate.   EKG: WNL no ST changes. AORTA SCAN: defer  Vicie Mutters 9:32 AM [facility]

## 2019-01-20 ENCOUNTER — Encounter: Payer: Self-pay | Admitting: Physician Assistant

## 2019-01-26 ENCOUNTER — Other Ambulatory Visit: Payer: Self-pay | Admitting: Physician Assistant

## 2019-02-03 NOTE — Progress Notes (Signed)
Complete Physical  Assessment and Plan:  Hypothyroidism, unspecified type Hypothyroidism-check TSH level, continue medications the same, reminded to take on an empty stomach 30-102mins before food.  -     TSH  Mixed hyperlipidemia -continue medications, check lipids, decrease fatty foods, increase activity.  -     Lipid panel  History of prediabetes Monitor weight  Medication management -     Magnesium  Vitamin D deficiency -     VITAMIN D 25 Hydroxy (Vit-D Deficiency, Fractures)  Encounter for general adult medical examination with abnormal findings 1 year  BMI 24.0-24.9, adult Monitor  Osteoporosis Continue fosamax, DEXA next year    Discussed med's effects and SE's. Screening labs and tests as requested with regular follow-up as recommended. Over 40 minutes of exam, counseling, chart review, and complex, high level critical decision making was performed this visit.   HPI  59 y.o. female  presents for a complete physical and follow up for has Hypothyroidism; Vitamin D deficiency; Hypertension; Mixed hyperlipidemia; History of prediabetes; and Medication management on their problem list..  Lost her dad, brother in law and has been having stress at work with a big project, has been more sick this year and states she has not been taking care of herself as well as she does.   She has been having chronic back pain this past year, not walking/working out. She has some neck pain and pain under her left shoulder, sharp. Has seen Dr. Latanya Graham in the past.  Had steroids, muscle relaxer and is following with him and it has improved.  Her blood pressure has been controlled at home, today their BP is BP: 118/66 She does workout, walking helps her legs. She denies chest pain, shortness of breath, dizziness.   She is on cholesterol medication and denies myalgias. Her cholesterol is at goal. The cholesterol last visit was:   Lab Results  Component Value Date   CHOL 222 (H) 01/15/2018    HDL 72 01/15/2018   LDLCALC 132 (H) 01/15/2018   TRIG 85 01/15/2018   CHOLHDL 3.1 01/15/2018   Last A1C in the office was:  Lab Results  Component Value Date   HGBA1C 5.1 01/14/2017   Patient is on Vitamin D supplement.   Lab Results  Component Value Date   VD25OH 48 01/15/2018     She is on thyroid medication. Her medication was changed last visit.   Lab Results  Component Value Date   TSH 2.67 01/15/2018  .  BMI is Body mass index is 24.78 kg/m., she is working on diet and exercise. Wt Readings from Last 3 Encounters:  02/04/19 163 lb (73.9 kg)  04/20/18 166 lb (75.3 kg)  03/30/18 166 lb 12.8 oz (75.7 kg)    Current Medications:  Current Outpatient Medications on File Prior to Visit  Medication Sig Dispense Refill  . alendronate (FOSAMAX) 70 MG tablet Take 1 tablet /week (every 7 days) 1st in the morning with 8 oz of water on an empty stomach for 30-60 minutes 12 tablet 3  . cholecalciferol (VITAMIN D) 1000 UNITS tablet Take 1,000 Units by mouth daily.    Marland Kitchen levothyroxine (SYNTHROID) 125 MCG tablet Take 1 tablet daily on an empty stomach with only water for 30 minutes & no Antacid meds, Calcium or Magnesium for 4 hours & avoid Biotin 90 tablet 0   No current facility-administered medications on file prior to visit.    Allergies:  Allergies  Allergen Reactions  . Codeine Nausea And Vomiting  Medical History:  She has Hypothyroidism; Vitamin D deficiency; Hypertension; Mixed hyperlipidemia; History of prediabetes; and Medication management on their problem list.  Health Maintenance:   Immunization History  Administered Date(s) Administered  . Influenza,inj,quad, With Preservative 07/10/2018  . PPD Test 08/09/2013, 09/18/2015  . Pneumococcal Polysaccharide-23 07/29/2012  . Td 04/15/2005  . Tdap 09/18/2015, 11/06/2018  . Zoster Recombinat (Shingrix) 08/21/2018, 11/06/2018   Tetanus: 2017 Pneumovax: 2013 Prevnar 13:  Flu vaccine: Zostavax:  Pap: DUE will  go see GYN MGM: 09/2018 [facility] DEXA: 02/2018 right hip -2.5 Colonoscopy: 04/2018 Dr. Loletha Graham EGD: CXR 2011  Patient Care Team: Jodi Pinto, MD as PCP - General (Internal Medicine)  Surgical History:  She has a past surgical history that includes Biopsy thyroid (2009) and Wisdom tooth extraction. Family History:  Herfamily history includes Cancer (age of onset: 98) in her paternal uncle; Dementia in her mother; Lymphoma (age of onset: 83) in her father; Lymphoma (age of onset: 66) in her paternal uncle. Social History:  She reports that she has quit smoking. She has never used smokeless tobacco. She reports current alcohol use of about 2.0 standard drinks of alcohol per week. She reports that she does not use drugs.  Review of Systems: Review of Systems  Constitutional: Negative.   HENT: Negative.   Eyes: Negative.   Respiratory: Negative.   Cardiovascular: Negative.   Gastrointestinal: Negative.   Genitourinary: Negative.   Musculoskeletal: Negative.   Skin: Negative.   Neurological: Negative.   Endo/Heme/Allergies: Negative.   Psychiatric/Behavioral: Negative.     Physical Exam: Estimated body mass index is 24.78 kg/m as calculated from the following:   Height as of this encounter: 5\' 8"  (1.727 m).   Weight as of this encounter: 163 lb (73.9 kg). BP 118/66   Pulse 63   Temp (!) 97.5 F (36.4 C)   Ht 5\' 8"  (1.727 m)   Wt 163 lb (73.9 kg)   SpO2 97%   BMI 24.78 kg/m  General Appearance: Well nourished, in no apparent distress.  Eyes: PERRLA, EOMs, conjunctiva no swelling or erythema, normal fundi and vessels.  Sinuses: No Frontal/maxillary tenderness  ENT/Mouth: bilateral ears with cerumen impaction. Good dentition. No erythema, swelling, or exudate on post pharynx. Tonsils not swollen or erythematous. Hearing decreased slightly Neck: posterior left hair line with erythema, has been there years. No scaly or itching. Supple, thyroid normal. No bruits  Respiratory:  Respiratory effort normal, BS equal bilaterally without rales, rhonchi, wheezing or stridor.  Cardio: RRR without murmurs, rubs or gallops. Brisk peripheral pulses without edema.  Chest: symmetric, with normal excursions and percussion.  Breasts: Symmetric, without lumps, nipple discharge, retractions.  Abdomen: Soft, nontender, no guarding, rebound, hernias, masses, or organomegaly.  Lymphatics: Non tender without lymphadenopathy.  Genitourinary: defer Musculoskeletal: Full ROM all peripheral extremities,5/5 strength, and normal gait.  Skin: left buttocks with 4 mm round, uniform nevus, will monitor. Warm, dry without rashes, lesions, ecchymosis. Neuro: Cranial nerves intact, reflexes equal bilaterally. Normal muscle tone, no cerebellar symptoms. Sensation intact.  Psych: Awake and oriented X 3, normal affect, Insight and Judgment appropriate.   EKG: WNL no ST changes. AORTA SCAN: defer  Vicie Mutters 4:18 PM [facility]

## 2019-02-04 ENCOUNTER — Other Ambulatory Visit: Payer: Self-pay

## 2019-02-04 ENCOUNTER — Encounter: Payer: Self-pay | Admitting: Physician Assistant

## 2019-02-04 ENCOUNTER — Ambulatory Visit (INDEPENDENT_AMBULATORY_CARE_PROVIDER_SITE_OTHER): Payer: 59 | Admitting: Physician Assistant

## 2019-02-04 VITALS — BP 118/66 | HR 63 | Temp 97.5°F | Ht 68.0 in | Wt 163.0 lb

## 2019-02-04 DIAGNOSIS — Z0001 Encounter for general adult medical examination with abnormal findings: Secondary | ICD-10-CM

## 2019-02-04 DIAGNOSIS — Z0000 Encounter for general adult medical examination without abnormal findings: Secondary | ICD-10-CM

## 2019-02-04 DIAGNOSIS — Z136 Encounter for screening for cardiovascular disorders: Secondary | ICD-10-CM | POA: Diagnosis not present

## 2019-02-04 DIAGNOSIS — Z1389 Encounter for screening for other disorder: Secondary | ICD-10-CM

## 2019-02-04 DIAGNOSIS — E782 Mixed hyperlipidemia: Secondary | ICD-10-CM

## 2019-02-04 DIAGNOSIS — Z87898 Personal history of other specified conditions: Secondary | ICD-10-CM

## 2019-02-04 DIAGNOSIS — I10 Essential (primary) hypertension: Secondary | ICD-10-CM

## 2019-02-04 DIAGNOSIS — M810 Age-related osteoporosis without current pathological fracture: Secondary | ICD-10-CM

## 2019-02-04 DIAGNOSIS — E559 Vitamin D deficiency, unspecified: Secondary | ICD-10-CM

## 2019-02-04 DIAGNOSIS — E039 Hypothyroidism, unspecified: Secondary | ICD-10-CM

## 2019-02-04 DIAGNOSIS — Z79899 Other long term (current) drug therapy: Secondary | ICD-10-CM

## 2019-02-04 NOTE — Patient Instructions (Signed)
GENERAL HEALTH GOALS  Know what a healthy weight is for you (roughly BMI <25) and aim to maintain this  Aim for 7+ servings of fruits and vegetables daily  70-80+ fluid ounces of water or unsweet tea for healthy kidneys  Limit to max 1 drink of alcohol per day; avoid smoking/tobacco  Limit animal fats in diet for cholesterol and heart health - choose grass fed whenever available  Avoid highly processed foods, and foods high in saturated/trans fats  Aim for low stress - take time to unwind and care for your mental health  Aim for 150 min of moderate intensity exercise weekly for heart health, and weights twice weekly for bone health  Aim for 7-9 hours of sleep daily   Osteoporosis  Osteoporosis is thinning and loss of density in your bones. Osteoporosis makes bones more brittle and fragile and more likely to break (fracture). Over time, osteoporosis can cause your bones to become so weak that they fracture after a minor fall. Bones in the hip, wrist, and spine are most likely to fracture due to osteoporosis. What are the causes? The exact cause of this condition is not known. What increases the risk? You may be at greater risk for osteoporosis if you:  Have a family history of the condition.  Have poor nutrition.  Use steroid medicines, such as prednisone.  Are female.  Are age 25 or older.  Smoke or have a history of smoking.  Are not physically active (are sedentary).  Are white (Caucasian) or of Asian descent.  Have a small body frame.  Take certain medicines, such as antiseizure medicines. What are the signs or symptoms? A fracture might be the first sign of osteoporosis, especially if the fracture results from a fall or injury that usually would not cause a bone to break. Other signs and symptoms include:  Pain in the neck or low back.  Stooped posture.  Loss of height. How is this diagnosed? This condition may be diagnosed based on:  Your medical  history.  A physical exam.  A bone mineral density test, also called a DXA or DEXA test (dual-energy X-ray absorptiometry test). This test uses X-rays to measure the amount of minerals in your bones. How is this treated? The goal of treatment is to strengthen your bones and lower your risk for a fracture. Treatment may involve:  Making lifestyle changes, such as: ? Including foods with more calcium and vitamin D in your diet. ? Doing weight-bearing and muscle-strengthening exercises. ? Stopping tobacco use. ? Limiting alcohol intake.  Taking medicine to slow the process of bone loss or to increase bone density.  Taking daily supplements of calcium and vitamin D.  Taking hormone replacement medicines, such as estrogen for women and testosterone for men.  Monitoring your levels of calcium and vitamin D. Follow these instructions at home:  Activity  Exercise as told by your health care provider. Ask your health care provider what exercises and activities are safe for you. You should do: ? Exercises that make you work against gravity (weight-bearing exercises), such as tai chi, yoga, or walking. ? Exercises to strengthen muscles, such as lifting weights. Lifestyle  Limit alcohol intake to no more than 1 drink a day for nonpregnant women and 2 drinks a day for men. One drink equals 12 oz of beer, 5 oz of wine, or 1 oz of hard liquor.  Do not use any products that contain nicotine or tobacco, such as cigarettes and e-cigarettes. If you  need help quitting, ask your health care provider. Preventing falls  Use devices to help you move around (mobility aids) as needed, such as canes, walkers, scooters, or crutches.  Keep rooms well-lit and clutter-free.  Remove tripping hazards from walkways, including cords and throw rugs.  Install grab bars in bathrooms and safety rails on stairs.  Use rubber mats in the bathroom and other areas that are often wet or slippery.  Wear closed-toe  shoes that fit well and support your feet. Wear shoes that have rubber soles or low heels.  Review your medicines with your health care provider. Some medicines can cause dizziness or changes in blood pressure, which can increase your risk of falling. General instructions  Include calcium and vitamin D in your diet. Calcium is important for bone health, and vitamin D helps your body to absorb calcium. Good sources of calcium and vitamin D include: ? Certain fatty fish, such as salmon and tuna. ? Products that have calcium and vitamin D added to them (fortified products), such as fortified cereals. ? Egg yolks. ? Cheese. ? Liver.  Take over-the-counter and prescription medicines only as told by your health care provider.  Keep all follow-up visits as told by your health care provider. This is important. Contact a health care provider if:  You have never been screened for osteoporosis and you are: ? A woman who is age 38 or older. ? A man who is age 56 or older. Get help right away if:  You fall or injure yourself. Summary  Osteoporosis is thinning and loss of density in your bones. This makes bones more brittle and fragile and more likely to break (fracture),even with minor falls.  The goal of treatment is to strengthen your bones and reduce your risk for a fracture.  Include calcium and vitamin D in your diet. Calcium is important for bone health, and vitamin D helps your body to absorb calcium.  Talk with your health care provider about screening for osteoporosis if you are a woman who is age 67 or older, or a man who is age 52 or older. This information is not intended to replace advice given to you by your health care provider. Make sure you discuss any questions you have with your health care provider. Document Released: 06/05/2005 Document Revised: 06/20/2017 Document Reviewed: 06/20/2017 Elsevier Interactive Patient Education  2019 [employer].

## 2019-02-05 LAB — COMPLETE METABOLIC PANEL WITH GFR
AG Ratio: 1.8 (calc) (ref 1.0–2.5)
ALT: 23 U/L (ref 6–29)
AST: 24 U/L (ref 10–35)
Albumin: 4.5 g/dL (ref 3.6–5.1)
Alkaline phosphatase (APISO): 73 U/L (ref 37–153)
BUN: 12 mg/dL (ref 7–25)
CO2: 26 mmol/L (ref 20–32)
Calcium: 9.8 mg/dL (ref 8.6–10.4)
Chloride: 102 mmol/L (ref 98–110)
Creat: 0.84 mg/dL (ref 0.50–1.05)
GFR, Est African American: 90 mL/min/{1.73_m2} (ref >=60)
GFR, Est Non African American: 78 mL/min/{1.73_m2} (ref >=60)
Globulin: 2.5 g/dL (calc) (ref 1.9–3.7)
Glucose, Bld: 92 mg/dL (ref 65–99)
Potassium: 4.5 mmol/L (ref 3.5–5.3)
Sodium: 139 mmol/L (ref 135–146)
Total Bilirubin: 0.5 mg/dL (ref 0.2–1.2)
Total Protein: 7 g/dL (ref 6.1–8.1)

## 2019-02-05 LAB — URINALYSIS, ROUTINE W REFLEX MICROSCOPIC
Bacteria, UA: NONE SEEN /HPF
Bilirubin Urine: NEGATIVE
Glucose, UA: NEGATIVE
Hgb urine dipstick: NEGATIVE
Hyaline Cast: NONE SEEN /LPF
Ketones, ur: NEGATIVE
Nitrite: NEGATIVE
Protein, ur: NEGATIVE
RBC / HPF: NONE SEEN /HPF (ref 0–2)
Specific Gravity, Urine: 1.01 (ref 1.001–1.03)
Squamous Epithelial / LPF: NONE SEEN /HPF (ref <=5)
WBC, UA: NONE SEEN /HPF (ref 0–5)
pH: 8 (ref 5.0–8.0)

## 2019-02-05 LAB — VITAMIN D 25 HYDROXY (VIT D DEFICIENCY, FRACTURES): Vit D, 25-Hydroxy: 37 ng/mL (ref 30–100)

## 2019-02-05 LAB — CBC WITH DIFFERENTIAL/PLATELET
Absolute Monocytes: 578 cells/uL (ref 200–950)
Basophils Absolute: 77 cells/uL (ref 0–200)
Basophils Relative: 1 %
Eosinophils Absolute: 270 cells/uL (ref 15–500)
Eosinophils Relative: 3.5 %
HCT: 39.4 % (ref 35.0–45.0)
Hemoglobin: 13.2 g/dL (ref 11.7–15.5)
Lymphs Abs: 2741 cells/uL (ref 850–3900)
MCH: 29.9 pg (ref 27.0–33.0)
MCHC: 33.5 g/dL (ref 32.0–36.0)
MCV: 89.1 fL (ref 80.0–100.0)
MPV: 10.4 fL (ref 7.5–12.5)
Monocytes Relative: 7.5 %
Neutro Abs: 4035 cells/uL (ref 1500–7800)
Neutrophils Relative %: 52.4 %
Platelets: 309 10*3/uL (ref 140–400)
RBC: 4.42 10*6/uL (ref 3.80–5.10)
RDW: 12.4 % (ref 11.0–15.0)
Total Lymphocyte: 35.6 %
WBC: 7.7 10*3/uL (ref 3.8–10.8)

## 2019-02-05 LAB — LIPID PANEL
Cholesterol: 214 mg/dL — ABNORMAL HIGH (ref <200)
HDL: 68 mg/dL (ref >=50)
LDL Cholesterol (Calc): 125 mg/dL (calc) — ABNORMAL HIGH
Non-HDL Cholesterol (Calc): 146 mg/dL (calc) — ABNORMAL HIGH (ref <130)
Total CHOL/HDL Ratio: 3.1 (calc) (ref <5.0)
Triglycerides: 104 mg/dL (ref <150)

## 2019-02-05 LAB — MICROALBUMIN / CREATININE URINE RATIO
Creatinine, Urine: 41 mg/dL (ref 20–275)
Microalb, Ur: <0.2 mg/dL

## 2019-02-05 LAB — HEMOGLOBIN A1C
Hgb A1c MFr Bld: 5.4 % of total Hgb (ref <5.7)
Mean Plasma Glucose: 108 (calc)
eAG (mmol/L): 6 (calc)

## 2019-02-05 LAB — TSH: TSH: 0.02 mIU/L — ABNORMAL LOW (ref 0.40–4.50)

## 2019-02-05 LAB — MAGNESIUM: Magnesium: 2.1 mg/dL (ref 1.5–2.5)

## 2019-05-18 ENCOUNTER — Other Ambulatory Visit: Payer: Self-pay | Admitting: Internal Medicine

## 2019-05-18 DIAGNOSIS — E039 Hypothyroidism, unspecified: Secondary | ICD-10-CM

## 2019-05-18 MED ORDER — LEVOTHYROXINE SODIUM 125 MCG PO TABS: ORAL_TABLET | ORAL | 3 refills | Status: DC

## 2019-07-13 ENCOUNTER — Encounter: Payer: Self-pay | Admitting: Internal Medicine

## 2019-10-13 LAB — HM MAMMOGRAPHY

## 2019-10-20 ENCOUNTER — Encounter: Payer: Self-pay | Admitting: *Deleted

## 2019-11-20 ENCOUNTER — Ambulatory Visit: Payer: 59 | Attending: Internal Medicine

## 2019-11-20 DIAGNOSIS — Z23 Encounter for immunization: Secondary | ICD-10-CM

## 2019-11-20 NOTE — Progress Notes (Signed)
   Covid-19 Vaccination Clinic  Name:  Jodi Graham    MRN: [id number] DOB: 21-Feb-1961  11/20/2019  Jodi Graham was observed post Covid-19 immunization for 15 minutes without incident. She was provided with Vaccine Information Sheet and instruction to access the V-Safe system.   Jodi Graham was instructed to call 911 with any severe reactions post vaccine: Marland Kitchen Difficulty breathing  . Swelling of face and throat  . A fast heartbeat  . A bad rash all over body  . Dizziness and weakness   Immunizations Administered    Name Date Dose VIS Date Route   Pfizer COVID-19 Vaccine 11/20/2019 12:39 PM 0.3 mL 08/20/2019 Intramuscular   Manufacturer: [place]   Lot: [id number]   [place]: [id number]

## 2019-12-14 ENCOUNTER — Ambulatory Visit: Payer: 59 | Attending: Internal Medicine

## 2019-12-14 DIAGNOSIS — Z23 Encounter for immunization: Secondary | ICD-10-CM

## 2019-12-14 NOTE — Progress Notes (Signed)
   Covid-19 Vaccination Clinic  Name:  Jodi Graham    MRN: [id number] DOB: December 06, 1960  12/14/2019  [name] was observed post Covid-19 immunization for 15 minutes without incident. She was provided with Vaccine Information Sheet and instruction to access the V-Safe system.   [name] was instructed to call 911 with any severe reactions post vaccine: Marland Kitchen Difficulty breathing  . Swelling of face and throat  . A fast heartbeat  . A bad rash all over body  . Dizziness and weakness   Immunizations Administered    Name Date Dose VIS Date Route   Pfizer COVID-19 Vaccine 12/14/2019  3:02 PM 0.3 mL 08/20/2019 Intramuscular   Manufacturer: [employer]   Lot: [id number]   [place]: [id number]

## 2020-01-06 ENCOUNTER — Other Ambulatory Visit: Payer: Self-pay | Admitting: Physician Assistant

## 2020-01-26 ENCOUNTER — Encounter: Payer: 59 | Admitting: Physician Assistant

## 2020-02-09 NOTE — Progress Notes (Signed)
Complete Physical  Assessment and Plan:  Bilateral hand pain ? From OA + nodules at PIP/DIP- stress ball Worse after food versus at a break- normal pulses bilateral If not better will get RA labs Check labs  Screening for cardiovascular condition -     EKG 12-Lead -     [place], RETROPERITNL ABD,  LTD  Screening for hematuria or proteinuria -     Urinalysis, Routine w reflex microscopic -     Microalbumin / creatinine urine ratio  History of smoking -     [place], RETROPERITNL ABD,  LTD  Screening, anemia, deficiency, iron -     Iron,Total/Total Iron Binding Cap -     Vitamin B12  Hypothyroidism, unspecified type Hypothyroidism-check TSH level, continue medications the same, reminded to take on an empty stomach 30-83mins before food. SWITCH TO A ONCE A DAY BASED ON THIS LAB -     TSH  Mixed hyperlipidemia -continue medications, check lipids, decrease fatty foods, increase activity.  -     Lipid panel  History of prediabetes Monitor weight  Medication management -     Magnesium  Vitamin D deficiency -     VITAMIN D 25 Hydroxy (Vit-D Deficiency, Fractures)  Encounter for general adult medical examination with abnormal findings 1 year  BMI 24.0-24.9, adult Monitor  Osteoporosis Continue fosamax, DEXA next year    Discussed med's effects and SE's. Screening labs and tests as requested with regular follow-up as recommended. Over 40 minutes of exam, counseling, chart review, and complex, high level critical decision making was performed this visit.   HPI  59 y.o. female  presents for a complete physical and follow up for has Hypothyroidism; Vitamin D deficiency; Mixed hyperlipidemia; History of prediabetes; Medication management; and Osteoporosis on their problem list..  She has vaginal dryness, overdue to see GYN, will go.   She has 9-10 tick bites this past saturday working in the yard.   She has 3 areas on her right forehead, white small bumps.   She states after  she eats she will get occ hand cramping, her fingers will be in odd position, has been happending more often.   Her blood pressure has been controlled at home, today their BP is BP: 120/82 She does workout, walking helps her legs. She denies chest pain, shortness of breath, dizziness.   She is on cholesterol medication and denies myalgias. Her cholesterol is at goal. The cholesterol last visit was:   Lab Results  Component Value Date   CHOL 214 (H) 02/04/2019   HDL 68 02/04/2019   LDLCALC 125 (H) 02/04/2019   TRIG 104 02/04/2019   CHOLHDL 3.1 02/04/2019   Last A1C in the office was:  Lab Results  Component Value Date   HGBA1C 5.4 02/04/2019   Patient is on Vitamin D supplement.   Lab Results  Component Value Date   VD25OH 37 02/04/2019     She is on thyroid medication. Her medication was changed last visit 1/2 TABLET EVERY DAY BUT 1 TABLET 2 DAYS A WEEK.   Lab Results  Component Value Date   TSH 0.02 (L) 02/04/2019  .  Current Medications:   Current Outpatient Medications (Endocrine & Metabolic):  .  alendronate (FOSAMAX) 70 MG tablet, TAKE 1 TABLET BY MOUTH ONCE WEEKLY ON AN EMPTY STOMACH BEFORE BREAKFAST. REMAIN UPRIGHT FOR 30 MINUTES & TAKE WITH 8 OUNCES OF WATER .  levothyroxine (SYNTHROID) 125 MCG tablet, Take 1 tablet daily on an empty stomach with only  water for 30 minutes & no Antacid meds, Calcium or Magnesium for 4 hours & avoid Biotin      Current Outpatient Medications (Other):  .  cholecalciferol (VITAMIN D) 1000 UNITS tablet, Take 1,000 Units by mouth daily.  Allergies:  Allergies  Allergen Reactions  . Codeine Nausea And Vomiting   Medical History:  She has Hypothyroidism; Vitamin D deficiency; Mixed hyperlipidemia; History of prediabetes; Medication management; and Osteoporosis on their problem list.  Health Maintenance:   Immunization History  Administered Date(s) Administered  . Influenza,inj,quad, With Preservative 07/10/2018  . PFIZER  SARS-COV-2 Vaccination 11/20/2019, 12/14/2019  . PPD Test 08/09/2013, 09/18/2015  . Pneumococcal Polysaccharide-23 07/29/2012  . Td 04/15/2005  . Tdap 09/18/2015, 11/06/2018  . Zoster Recombinat (Shingrix) 08/21/2018, 11/06/2018   Health Maintenance  Topic Date Due  . PAP SMEAR-Modifier  Never done  . DEXA SCAN  02/10/2020  . INFLUENZA VACCINE  04/09/2020  . MAMMOGRAM  10/12/2020  . COLONOSCOPY  04/21/2023  . TETANUS/TDAP  11/06/2028  . COVID-19 Vaccine  Completed  . Hepatitis C Screening  Completed  . HIV Screening  Completed    UTD  Pap: DUE will go see GYN MGM: 09/2019 [facility] DEXA: 02/2018 right hip -2.5- has been on fosamax x 2019 OVERDUE Colonoscopy: 04/2018 Dr. Loletha Carrow EGD: CXR 2011  Patient Care Team: Unk Pinto, MD as PCP - General (Internal Medicine)  Surgical History:  She has a past surgical history that includes Biopsy thyroid (2009) and Wisdom tooth extraction. Family History:  Herfamily history includes Cancer (age of onset: 59) in her paternal uncle; Dementia in her mother; Lymphoma (age of onset: 83) in her father; Lymphoma (age of onset: 27) in her paternal uncle. Social History:  She reports that she has quit smoking. She has never used smokeless tobacco. She reports current alcohol use of about 2.0 standard drinks of alcohol per week. She reports that she does not use drugs.  Review of Systems: Review of Systems  Constitutional: Negative.   HENT: Negative.   Eyes: Negative.   Respiratory: Negative.   Cardiovascular: Negative.   Gastrointestinal: Negative.   Genitourinary: Negative.   Musculoskeletal: Negative.   Skin: Negative.   Neurological: Negative.   Endo/Heme/Allergies: Negative.   Psychiatric/Behavioral: Negative.     Physical Exam: Estimated body mass index is 25.86 kg/m as calculated from the following:   Height as of this encounter: 5' 8.5" (1.74 m).   Weight as of this encounter: 172 lb 9.6 oz (78.3 kg). BP 120/82   Pulse  74   Temp (!) 97.2 F (36.2 C)   Ht 5' 8.5" (1.74 m)   Wt 172 lb 9.6 oz (78.3 kg)   SpO2 95%   BMI 25.86 kg/m  General Appearance: Well nourished, in no apparent distress.  Eyes: PERRLA, EOMs, conjunctiva no swelling or erythema, normal fundi and vessels.  Sinuses: No Frontal/maxillary tenderness  ENT/Mouth: bilateral ears with cerumen impaction. Good dentition. No erythema, swelling, or exudate on post pharynx. Tonsils not swollen or erythematous. Hearing decreased slightly Neck: posterior left hair line with erythema, has been there years. No scaly or itching. Supple, thyroid normal. No bruits  Respiratory: Respiratory effort normal, BS equal bilaterally without rales, rhonchi, wheezing or stridor.  Cardio: RRR without murmurs, rubs or gallops. Brisk peripheral pulses without edema.  Chest: symmetric, with normal excursions and percussion.  Breasts: Symmetric, without lumps, nipple discharge, retractions.  Abdomen: Soft, nontender, no guarding, rebound, hernias, masses, or organomegaly.  Lymphatics: Non tender without lymphadenopathy.  Genitourinary:  defer Musculoskeletal: Full ROM all peripheral extremities,5/5 strength, and normal gait.  Skin: right temporal area with 3 small white nodules/seb cyst.  Warm, dry without rashes, lesions, ecchymosis. Neuro: Cranial nerves intact, reflexes equal bilaterally. Normal muscle tone, no cerebellar symptoms. Sensation intact.  Psych: Awake and oriented X 3, normal affect, Insight and Judgment appropriate.   EKG: WNL no ST changes. AORTA SCAN: defer  Vicie Mutters 3:19 PM [facility]

## 2020-02-10 ENCOUNTER — Other Ambulatory Visit: Payer: Self-pay

## 2020-02-10 ENCOUNTER — Ambulatory Visit (INDEPENDENT_AMBULATORY_CARE_PROVIDER_SITE_OTHER): Payer: 59 | Admitting: Physician Assistant

## 2020-02-10 ENCOUNTER — Encounter: Payer: Self-pay | Admitting: Physician Assistant

## 2020-02-10 VITALS — BP 120/82 | HR 74 | Temp 97.2°F | Ht 68.5 in | Wt 172.6 lb

## 2020-02-10 DIAGNOSIS — I10 Essential (primary) hypertension: Secondary | ICD-10-CM

## 2020-02-10 DIAGNOSIS — E559 Vitamin D deficiency, unspecified: Secondary | ICD-10-CM

## 2020-02-10 DIAGNOSIS — Z130 Encounter for screening for diseases of the blood and blood-forming organs and certain disorders involving the immune mechanism: Secondary | ICD-10-CM

## 2020-02-10 DIAGNOSIS — Z0000 Encounter for general adult medical examination without abnormal findings: Secondary | ICD-10-CM | POA: Diagnosis not present

## 2020-02-10 DIAGNOSIS — Z0001 Encounter for general adult medical examination with abnormal findings: Secondary | ICD-10-CM

## 2020-02-10 DIAGNOSIS — Z136 Encounter for screening for cardiovascular disorders: Secondary | ICD-10-CM

## 2020-02-10 DIAGNOSIS — Z79899 Other long term (current) drug therapy: Secondary | ICD-10-CM

## 2020-02-10 DIAGNOSIS — M79642 Pain in left hand: Secondary | ICD-10-CM

## 2020-02-10 DIAGNOSIS — R7309 Other abnormal glucose: Secondary | ICD-10-CM

## 2020-02-10 DIAGNOSIS — E039 Hypothyroidism, unspecified: Secondary | ICD-10-CM

## 2020-02-10 DIAGNOSIS — Z1389 Encounter for screening for other disorder: Secondary | ICD-10-CM

## 2020-02-10 DIAGNOSIS — Z87891 Personal history of nicotine dependence: Secondary | ICD-10-CM

## 2020-02-10 DIAGNOSIS — M79641 Pain in right hand: Secondary | ICD-10-CM

## 2020-02-10 DIAGNOSIS — E782 Mixed hyperlipidemia: Secondary | ICD-10-CM

## 2020-02-10 DIAGNOSIS — M810 Age-related osteoporosis without current pathological fracture: Secondary | ICD-10-CM

## 2020-02-10 NOTE — Patient Instructions (Addendum)
Go get your PAP smear  Will calculate thyroid dose based off lab and new dose.    Get stress ball and put your hand under warm water and squeeze Can also try voltern gel over the counter.     VAGINAL DRYNESS OVERVIEW  Vaginal dryness, also known as atrophic vaginitis, is a common condition in postmenopausal women. This condition is also common in women who have had both ovaries removed at the time of hysterectomy.   Some women have uncomfortable symptoms of vaginal dryness, such as pain with sex, burning vaginal discomfort or itching, or abnormal vaginal discharge, while others have no symptoms at all.  VAGINAL DRYNESS CAUSES   Estrogen helps to keep the vagina moist and to maintain thickness of the vaginal lining. Vaginal dryness occurs when the ovaries produce a decreased amount of estrogen. This can occur at certain times in a woman's life, and may be permanent or temporary. Times when less estrogen is made include: ?At the time of menopause. ?After surgical removal of the ovaries, chemotherapy, or radiation therapy of the pelvis for cancer. ?After having a baby, particularly in women who breastfeed. ?While using certain medications, such as danazol, medroxyprogesterone (brand names: Provera or DepoProvera), leuprolide (brand name: Lupron), or nafarelin. When these medications are stopped, estrogen production resumes.  Women who smoke cigarettes have been shown to have an increased risk of an earlier menopause transition as compared to non-smokers. Therefore, atrophic vaginitis symptoms may appear at a younger age in this population.  VAGINAL DRYNESS TREATMENT   There are three treatment options for women with vaginal dryness:  Vaginal lubricants and moisturizers -- Vaginal lubricants and moisturizers can be purchased without a prescription. These products do not contain any hormones and have virtually no side effects. - Albolene is found in the facial cleanser section at [facility],  [facility], or [employer]. It is a large jar with a blue top. This is the best lubricant for women because it is hypoallergenic. -Natural lubricants, such as olive, avocado or peanut oil, are easily available products that may be used as a lubricant with sex.  -Vaginal moisturizes (eg, Replens, Moist Again, Vagisil, K-Y Silk-E, and Feminease) are formulated to allow water to be retained in the vaginal tissues. Moisturizers are applied into the vagina three times weekly to allow a continued moisturizing effect. These should not be used just before having sex, as they can be irritating.  Vaginal estrogen -- Vaginal estrogen is the most effective treatment option for women with vaginal dryness. Vaginal estrogen must be prescribed by a healthcare provider. Very low doses of vaginal estrogen can be used when it is put into the vagina to treat vaginal dryness. A small amount of estrogen is absorbed into the bloodstream, but only about 100 times less than when using estrogen pills or tablets. As a result, there is a much lower risk of side effects, such as blood clots, breast cancer, and heart attack, compared with other estrogen-containing products (birth control pills, menopausal hormone therapy).   Ospemifene -- Ospemifene is a prescription medication that is similar to estrogen, but is not estrogen. In the vaginal tissue, it acts similarly to estrogen. In the breast tissue, it acts as an estrogen blocker. It comes in a pill, and is prescribed for women who want to use an estrogen-like medication for vaginal dryness or painful sex associated with vaginal dryness, but prefer not to use a vaginal medication. The medication may cause hot flashes as a side effect. This type of medication may  increase the risk of blood clots or uterine cancer. Further study of ospemifene is needed to evaluate the risk of these complications. This medication has not been tested in women who have had breast cancer or are at a high risk of  developing breast cancer.    Sexual activity -- Vaginal estrogen improves vaginal dryness quickly, usually within a few weeks. You may continue to have sex as you treat vaginal dryness because sex itself can help to keep the vaginal tissues healthy. Vaginal intercourse may help the vaginal tissues by keeping them soft and stretchable and preventing the tissues from shrinking.  If sex continues to be painful despite treatment for vaginal dryness, talk to your healthcare provider.

## 2020-02-11 LAB — COMPLETE METABOLIC PANEL WITH GFR
AG Ratio: 1.9 (calc) (ref 1.0–2.5)
ALT: 18 U/L (ref 6–29)
AST: 19 U/L (ref 10–35)
Albumin: 4.5 g/dL (ref 3.6–5.1)
Alkaline phosphatase (APISO): 55 U/L (ref 37–153)
BUN: 14 mg/dL (ref 7–25)
CO2: 33 mmol/L — ABNORMAL HIGH (ref 20–32)
Calcium: 9.5 mg/dL (ref 8.6–10.4)
Chloride: 103 mmol/L (ref 98–110)
Creat: 0.95 mg/dL (ref 0.50–1.05)
GFR, Est African American: 77 mL/min/{1.73_m2} (ref >=60)
GFR, Est Non African American: 66 mL/min/{1.73_m2} (ref >=60)
Globulin: 2.4 g/dL (calc) (ref 1.9–3.7)
Glucose, Bld: 92 mg/dL (ref 65–99)
Potassium: 4.8 mmol/L (ref 3.5–5.3)
Sodium: 141 mmol/L (ref 135–146)
Total Bilirubin: 0.4 mg/dL (ref 0.2–1.2)
Total Protein: 6.9 g/dL (ref 6.1–8.1)

## 2020-02-11 LAB — CBC WITH DIFFERENTIAL/PLATELET
Absolute Monocytes: 659 cells/uL (ref 200–950)
Basophils Absolute: 89 cells/uL (ref 0–200)
Basophils Relative: 1 %
Eosinophils Absolute: 454 cells/uL (ref 15–500)
Eosinophils Relative: 5.1 %
HCT: 39.1 % (ref 35.0–45.0)
Hemoglobin: 13 g/dL (ref 11.7–15.5)
Lymphs Abs: 2910 cells/uL (ref 850–3900)
MCH: 30.9 pg (ref 27.0–33.0)
MCHC: 33.2 g/dL (ref 32.0–36.0)
MCV: 92.9 fL (ref 80.0–100.0)
MPV: 10.3 fL (ref 7.5–12.5)
Monocytes Relative: 7.4 %
Neutro Abs: 4788 cells/uL (ref 1500–7800)
Neutrophils Relative %: 53.8 %
Platelets: 282 10*3/uL (ref 140–400)
RBC: 4.21 10*6/uL (ref 3.80–5.10)
RDW: 12.5 % (ref 11.0–15.0)
Total Lymphocyte: 32.7 %
WBC: 8.9 10*3/uL (ref 3.8–10.8)

## 2020-02-11 LAB — TSH: TSH: 20.07 mIU/L — ABNORMAL HIGH (ref 0.40–4.50)

## 2020-02-11 LAB — LIPID PANEL
Cholesterol: 246 mg/dL — ABNORMAL HIGH (ref <200)
HDL: 75 mg/dL (ref >=50)
LDL Cholesterol (Calc): 142 mg/dL (calc) — ABNORMAL HIGH
Non-HDL Cholesterol (Calc): 171 mg/dL (calc) — ABNORMAL HIGH (ref <130)
Total CHOL/HDL Ratio: 3.3 (calc) (ref <5.0)
Triglycerides: 155 mg/dL — ABNORMAL HIGH (ref <150)

## 2020-02-11 LAB — URINALYSIS, ROUTINE W REFLEX MICROSCOPIC
Bilirubin Urine: NEGATIVE
Glucose, UA: NEGATIVE
Hgb urine dipstick: NEGATIVE
Ketones, ur: NEGATIVE
Leukocytes,Ua: NEGATIVE
Nitrite: NEGATIVE
Protein, ur: NEGATIVE
Specific Gravity, Urine: 1.017 (ref 1.001–1.03)
pH: 6.5 (ref 5.0–8.0)

## 2020-02-11 LAB — MICROALBUMIN / CREATININE URINE RATIO
Creatinine, Urine: 99 mg/dL (ref 20–275)
Microalb Creat Ratio: 2 mcg/mg creat (ref <30)
Microalb, Ur: 0.2 mg/dL

## 2020-02-11 LAB — VITAMIN D 25 HYDROXY (VIT D DEFICIENCY, FRACTURES): Vit D, 25-Hydroxy: 32 ng/mL (ref 30–100)

## 2020-02-11 LAB — VITAMIN B12: Vitamin B-12: 354 pg/mL (ref 200–1100)

## 2020-02-11 LAB — IRON, TOTAL/TOTAL IRON BINDING CAP
%SAT: 23 % (calc) (ref 16–45)
Iron: 72 ug/dL (ref 45–160)
TIBC: 313 mcg/dL (calc) (ref 250–450)

## 2020-02-11 LAB — MAGNESIUM: Magnesium: 2.1 mg/dL (ref 1.5–2.5)

## 2020-03-21 LAB — HM DEXA SCAN

## 2020-05-31 ENCOUNTER — Other Ambulatory Visit: Payer: Self-pay | Admitting: Internal Medicine

## 2020-05-31 DIAGNOSIS — E039 Hypothyroidism, unspecified: Secondary | ICD-10-CM

## 2020-08-14 ENCOUNTER — Other Ambulatory Visit: Payer: Self-pay

## 2020-08-14 ENCOUNTER — Encounter: Payer: Self-pay | Admitting: Adult Health Nurse Practitioner

## 2020-08-14 ENCOUNTER — Ambulatory Visit (INDEPENDENT_AMBULATORY_CARE_PROVIDER_SITE_OTHER): Payer: No Typology Code available for payment source | Admitting: Adult Health Nurse Practitioner

## 2020-08-14 VITALS — BP 120/86 | HR 86 | Temp 97.2°F | Ht 68.5 in | Wt 173.8 lb

## 2020-08-14 DIAGNOSIS — Z20822 Contact with and (suspected) exposure to covid-19: Secondary | ICD-10-CM

## 2020-08-14 DIAGNOSIS — E559 Vitamin D deficiency, unspecified: Secondary | ICD-10-CM

## 2020-08-14 DIAGNOSIS — E039 Hypothyroidism, unspecified: Secondary | ICD-10-CM

## 2020-08-14 DIAGNOSIS — R7309 Other abnormal glucose: Secondary | ICD-10-CM | POA: Diagnosis not present

## 2020-08-14 DIAGNOSIS — E782 Mixed hyperlipidemia: Secondary | ICD-10-CM | POA: Diagnosis not present

## 2020-08-14 DIAGNOSIS — Z79899 Other long term (current) drug therapy: Secondary | ICD-10-CM

## 2020-08-14 NOTE — Progress Notes (Signed)
FOLLOW UP 3 MONTH    Assessment / Plan:    Hypothyroidism, unspecified type Hypothyroidism-check TSH level, continue medications the same, reminded to take on an empty stomach 30-63mins before food. SWITCH TO A ONCE A DAY BASED ON THIS LAB -     TSH  Mixed hyperlipidemia -continue medications, check lipids, decrease fatty foods, increase activity.  -     Lipid panel  History of prediabetes Monitor weight  Medication management -     Magnesium  Vitamin D deficiency -     VITAMIN D 25 Hydroxy (Vit-D Deficiency, Fractures)   BMI 24.0-24.9, adult Monitor  Osteoporosis Has stopped fosamax DEXA one year Reassess need Continue Ca+ & Vit D if Vit D low round of Rx?    Discussed med's effects and SE's. Screening labs and tests as requested with regular follow-up as recommended. Over 40 minutes of exam, counseling, chart review, and complex, high level critical decision making was performed this visit.   HPI  59 y.o. female  presents for a complete physical and follow up for has Hypothyroidism; Vitamin D deficiency; Mixed hyperlipidemia; History of prediabetes; Medication management; and Osteoporosis on their problem list..  She reports overall she is doing well.  No health or medication concerns today.  She is due for Pap, follows with Dr Dellis Filbert for her womens health.  Her blood pressure has been controlled at home, today their BP is BP: 120/86 She does workout, walking helps her legs. She denies chest pain, shortness of breath, dizziness.   She is on cholesterol medication and denies myalgias. Her cholesterol is at goal. The cholesterol last visit was:   Lab Results  Component Value Date   CHOL 246 (H) 02/10/2020   HDL 75 02/10/2020   LDLCALC 142 (H) 02/10/2020   TRIG 155 (H) 02/10/2020   CHOLHDL 3.3 02/10/2020   Last A1C in the office was:  Lab Results  Component Value Date   HGBA1C 5.4 02/04/2019   Patient is on Vitamin D supplement.   Lab Results   Component Value Date   VD25OH 32 02/10/2020     She is on thyroid medication. Her medication was changed last visit 1/2 tablet 4 days a week and whole tablet 3 days a a week. Lab Results  Component Value Date   TSH 20.07 (H) 02/10/2020  .  Current Medications:   Current Outpatient Medications (Endocrine & Metabolic):  .  levothyroxine (SYNTHROID) 125 MCG tablet, Take 1 tablet    Daily     on an empty stomach with only water for 30 minutes & no Antacid meds, Calcium or Magnesium for 4 hours & avoid Biotin      Current Outpatient Medications (Other):  .  cholecalciferol (VITAMIN D) 1000 UNITS tablet, Take 1,000 Units by mouth daily.  Allergies:  Allergies  Allergen Reactions  . Codeine Nausea And Vomiting   Medical History:  She has Hypothyroidism; Vitamin D deficiency; Mixed hyperlipidemia; History of prediabetes; Medication management; and Osteoporosis on their problem list.  Health Maintenance:   Immunization History  Administered Date(s) Administered  . Influenza,inj,quad, With Preservative 07/10/2018  . PFIZER SARS-COV-2 Vaccination 11/20/2019, 12/14/2019  . PPD Test 08/09/2013, 09/18/2015  . Pneumococcal Polysaccharide-23 07/29/2012  . Td 04/15/2005  . Tdap 09/18/2015, 11/06/2018  . Zoster Recombinat (Shingrix) 08/21/2018, 11/06/2018   Health Maintenance  Topic Date Due  . PAP SMEAR-Modifier  Never done  . INFLUENZA VACCINE  04/09/2020  . MAMMOGRAM  10/12/2020  . DEXA SCAN  03/21/2022  .  COLONOSCOPY  04/21/2023  . TETANUS/TDAP  11/06/2028  . COVID-19 Vaccine  Completed  . Hepatitis C Screening  Completed  . HIV Screening  Completed    UTD  Pap: DUE will go see GYN [facility]GM: 10/2019 [facility]  DEXA: 03/2020 / right hip -2.5-  on fosamax Q2years Colonoscopy: 04/2018 Dr. Loletha Carrow CXR 2011  Patient Care Team: Unk Pinto, MD as PCP - General (Internal Medicine)  Surgical History:  She has a past surgical history that includes Biopsy thyroid (2009) and Wisdom  tooth extraction. Family History:  Herfamily history includes Cancer (age of onset: 83) in her paternal uncle; Dementia in her mother; Lymphoma (age of onset: 51) in her father; Lymphoma (age of onset: 84) in her paternal uncle. Social History:  She reports that she has quit smoking. She has never used smokeless tobacco. She reports current alcohol use of about 2.0 standard drinks of alcohol per week. She reports that she does not use drugs.  Review of Systems: Review of Systems  Constitutional: Negative.   HENT: Negative.   Eyes: Negative.   Respiratory: Negative.   Cardiovascular: Negative.   Gastrointestinal: Negative.   Genitourinary: Negative.   Musculoskeletal: Negative.   Skin: Negative.   Neurological: Negative.   Endo/Heme/Allergies: Negative.   Psychiatric/Behavioral: Negative.     Physical Exam: Estimated body mass index is 26.04 kg/m as calculated from the following:   Height as of this encounter: 5' 8.5" (1.74 m).   Weight as of this encounter: 173 lb 12.8 oz (78.8 kg). BP 120/86   Pulse 86   Temp (!) 97.2 F (36.2 C)   Ht 5' 8.5" (1.74 m)   Wt 173 lb 12.8 oz (78.8 kg)   SpO2 98%   BMI 26.04 kg/m  General Appearance: Well nourished, in no apparent distress.  Eyes: PERRLA, EOMs, conjunctiva no swelling or erythema, normal fundi and vessels.  Sinuses: No Frontal/maxillary tenderness  ENT/Mouth: bilateral ears with cerumen impaction. Good dentition. No erythema, swelling, or exudate on post pharynx. Tonsils not swollen or erythematous. Hearing decreased slightly Neck: posterior left hair line with erythema, has been there years. No scaly or itching. Supple, thyroid normal. No bruits  Respiratory: Respiratory effort normal, BS equal bilaterally without rales, rhonchi, wheezing or stridor.  Cardio: RRR without murmurs, rubs or gallops. Brisk peripheral pulses without edema.  Chest: symmetric, with normal excursions and percussion.  Breasts: Symmetric, without  lumps, nipple discharge, retractions.  Abdomen: Soft, nontender, no guarding, rebound, hernias, masses, or organomegaly.  Lymphatics: Non tender without lymphadenopathy.  Genitourinary: defer Musculoskeletal: Full ROM all peripheral extremities,5/5 strength, and normal gait.  Skin: right temporal area with 3 small white nodules/seb cyst.  Warm, dry without rashes, lesions, ecchymosis. Neuro: Cranial nerves intact, reflexes equal bilaterally. Normal muscle tone, no cerebellar symptoms. Sensation intact.  Psych: Awake and oriented X 3, normal affect, Insight and Judgment appropriate.     Garnet Sierras, Laqueta Jean, DNP [facility] 08/14/2020  4:38 PM

## 2020-08-16 LAB — COMPLETE METABOLIC PANEL WITH GFR
AG Ratio: 2 (calc) (ref 1.0–2.5)
ALT: 30 U/L — ABNORMAL HIGH (ref 6–29)
AST: 28 U/L (ref 10–35)
Albumin: 4.6 g/dL (ref 3.6–5.1)
Alkaline phosphatase (APISO): 89 U/L (ref 37–153)
BUN: 11 mg/dL (ref 7–25)
CO2: 28 mmol/L (ref 20–32)
Calcium: 9.7 mg/dL (ref 8.6–10.4)
Chloride: 103 mmol/L (ref 98–110)
Creat: 0.7 mg/dL (ref 0.50–1.05)
GFR, Est African American: 111 mL/min/{1.73_m2} (ref >=60)
GFR, Est Non African American: 96 mL/min/{1.73_m2} (ref >=60)
Globulin: 2.3 g/dL (calc) (ref 1.9–3.7)
Glucose, Bld: 88 mg/dL (ref 65–99)
Potassium: 4.6 mmol/L (ref 3.5–5.3)
Sodium: 141 mmol/L (ref 135–146)
Total Bilirubin: 0.7 mg/dL (ref 0.2–1.2)
Total Protein: 6.9 g/dL (ref 6.1–8.1)

## 2020-08-16 LAB — CBC WITH DIFFERENTIAL/PLATELET
Absolute Monocytes: 670 cells/uL (ref 200–950)
Basophils Absolute: 69 cells/uL (ref 0–200)
Basophils Relative: 0.9 %
Eosinophils Absolute: 277 cells/uL (ref 15–500)
Eosinophils Relative: 3.6 %
HCT: 39.3 % (ref 35.0–45.0)
Hemoglobin: 12.9 g/dL (ref 11.7–15.5)
Lymphs Abs: 2710 cells/uL (ref 850–3900)
MCH: 29.3 pg (ref 27.0–33.0)
MCHC: 32.8 g/dL (ref 32.0–36.0)
MCV: 89.3 fL (ref 80.0–100.0)
MPV: 10.5 fL (ref 7.5–12.5)
Monocytes Relative: 8.7 %
Neutro Abs: 3973 cells/uL (ref 1500–7800)
Neutrophils Relative %: 51.6 %
Platelets: 278 10*3/uL (ref 140–400)
RBC: 4.4 10*6/uL (ref 3.80–5.10)
RDW: 12 % (ref 11.0–15.0)
Total Lymphocyte: 35.2 %
WBC: 7.7 10*3/uL (ref 3.8–10.8)

## 2020-08-16 LAB — LIPID PANEL
Cholesterol: 220 mg/dL — ABNORMAL HIGH (ref <200)
HDL: 69 mg/dL (ref >=50)
LDL Cholesterol (Calc): 128 mg/dL (calc) — ABNORMAL HIGH
Non-HDL Cholesterol (Calc): 151 mg/dL (calc) — ABNORMAL HIGH (ref <130)
Total CHOL/HDL Ratio: 3.2 (calc) (ref <5.0)
Triglycerides: 119 mg/dL (ref <150)

## 2020-08-16 LAB — SARS-COV-2 SEMI-QUANTITATIVE TOTAL ANTIBODY, SPIKE: SARS COV2 AB, Total Spike Semi QN: 369 U/mL — ABNORMAL HIGH (ref <0.8)

## 2020-08-16 LAB — VITAMIN D 25 HYDROXY (VIT D DEFICIENCY, FRACTURES): Vit D, 25-Hydroxy: 48 ng/mL (ref 30–100)

## 2020-08-16 LAB — TSH: TSH: 0.99 mIU/L (ref 0.40–4.50)

## 2020-09-19 ENCOUNTER — Other Ambulatory Visit: Payer: Self-pay | Admitting: *Deleted

## 2020-09-19 DIAGNOSIS — E039 Hypothyroidism, unspecified: Secondary | ICD-10-CM

## 2020-09-19 MED ORDER — LEVOTHYROXINE SODIUM 125 MCG PO TABS: ORAL_TABLET | ORAL | 0 refills | Status: DC

## 2020-10-17 LAB — HM MAMMOGRAPHY

## 2020-11-02 ENCOUNTER — Encounter: Payer: Self-pay | Admitting: *Deleted

## 2021-01-21 ENCOUNTER — Other Ambulatory Visit: Payer: Self-pay | Admitting: Adult Health Nurse Practitioner

## 2021-01-21 DIAGNOSIS — E039 Hypothyroidism, unspecified: Secondary | ICD-10-CM

## 2021-02-13 ENCOUNTER — Encounter: Payer: 59 | Admitting: Adult Health Nurse Practitioner

## 2021-10-22 LAB — HM MAMMOGRAPHY

## 2021-10-25 ENCOUNTER — Encounter: Payer: Self-pay | Admitting: Internal Medicine

## 2022-01-23 NOTE — Progress Notes (Signed)
    Future Appointments  Date Time Provider Department  01/24/2022  2:30 PM Unk Pinto, MD [facility]    History of Present Illness:     Patient is a very nice 61 yo MWF with hx/o LBP predating since about 2000 . Has seen Dr Rhona Raider in the past & advised that she has Cx 5 + 6 and low Lumbar 5  DJD  She reports ongoing neck & LBP w/o radicular sx's. She also has concerns re: a "lump" in her low back area.   Medications    levothyroxine (SYNTHROID) 125 MCG tablet, Take  1 tablet  Daily    cholecalciferol (VITAMIN D) 1000 UNITS tablet, Take 1,000 Units  daily.  Problem list She has Hypothyroidism; Vitamin D deficiency; Mixed hyperlipidemia; History of prediabetes; Medication management; and Osteoporosis on their problem list.   Observations/Objective:  BP 122/78   Pulse 63   Temp (!) 97.3 F (36.3 C)   Wt 170 lb (77.1 kg)   SpO2 99%   BMI 25.47 kg/m   HEENT - WNL. Neck - supple.  Chest - Clear equal BS. Cor - Nl HS. RRR w/o sig MGR. PP 1(+). No edema. MS- FROM w/o deformities.  Gait Nl.Nl bilat. hip ROM.  Skin - there is a firm non-tender mobile  ~ 2 x 4  cm mobile subcutaneous mass suspected fibroma in the left para-lumbar area.  Neuro -  Nl w/o focal abnormalities. Negative SLR bilaterally.    Assessment and Plan:   1. Low back pain without sciatica,  - gabapentin (NEURONTIN) 100 MG capsule;  Take  1 to 2 capsules  3 x /day   for Back Pain   Dispense: 60 capsule; Refill: 1  - dexamethasone (DECADRON) 4 MG tablet;  Take 1 tab 3 x day - 3 days, then 2 x day - 3 days, then 1 tab daily   Dispense: 20 tablet; Refill: 0  - Discussed stretching exercises & trial with heating pad.   Follow Up Instructions:       I discussed the assessment and treatment plan with the patient. The patient was provided an opportunity to ask questions and all were answered. The patient agreed with the plan and demonstrated an understanding of the instructions.        The patient  was advised to call back or seek an in-person evaluation if the symptoms worsen or if the condition fails to improve as anticipated.   Kirtland Bouchard, MD

## 2022-01-24 ENCOUNTER — Encounter: Payer: Self-pay | Admitting: Internal Medicine

## 2022-01-24 ENCOUNTER — Ambulatory Visit (INDEPENDENT_AMBULATORY_CARE_PROVIDER_SITE_OTHER): Payer: No Typology Code available for payment source | Admitting: Internal Medicine

## 2022-01-24 VITALS — BP 122/78 | HR 63 | Temp 97.3°F | Ht 68.5 in | Wt 170.0 lb

## 2022-01-24 DIAGNOSIS — M545 Low back pain, unspecified: Secondary | ICD-10-CM

## 2022-01-24 MED ORDER — GABAPENTIN 100 MG PO CAPS: ORAL_CAPSULE | ORAL | 1 refills | Status: DC

## 2022-01-24 MED ORDER — DEXAMETHASONE 4 MG PO TABS: ORAL_TABLET | ORAL | 0 refills | Status: DC

## 2022-01-27 ENCOUNTER — Encounter: Payer: Self-pay | Admitting: Internal Medicine

## 2022-02-07 ENCOUNTER — Other Ambulatory Visit: Payer: Self-pay | Admitting: Internal Medicine

## 2022-02-07 DIAGNOSIS — E039 Hypothyroidism, unspecified: Secondary | ICD-10-CM

## 2022-02-07 MED ORDER — LEVOTHYROXINE SODIUM 125 MCG PO TABS: ORAL_TABLET | ORAL | 0 refills | Status: DC

## 2022-02-18 ENCOUNTER — Other Ambulatory Visit: Payer: Self-pay

## 2022-02-18 DIAGNOSIS — M545 Low back pain, unspecified: Secondary | ICD-10-CM

## 2022-02-23 ENCOUNTER — Other Ambulatory Visit: Payer: Self-pay | Admitting: Internal Medicine

## 2022-02-23 DIAGNOSIS — M545 Low back pain, unspecified: Secondary | ICD-10-CM

## 2022-02-23 MED ORDER — GABAPENTIN 100 MG PO CAPS: ORAL_CAPSULE | ORAL | 1 refills | Status: DC

## 2022-02-27 ENCOUNTER — Encounter: Payer: Self-pay | Admitting: Nurse Practitioner

## 2022-02-27 ENCOUNTER — Ambulatory Visit (INDEPENDENT_AMBULATORY_CARE_PROVIDER_SITE_OTHER): Payer: No Typology Code available for payment source | Admitting: Nurse Practitioner

## 2022-02-27 DIAGNOSIS — M810 Age-related osteoporosis without current pathological fracture: Secondary | ICD-10-CM

## 2022-02-27 DIAGNOSIS — Z79899 Other long term (current) drug therapy: Secondary | ICD-10-CM

## 2022-02-27 DIAGNOSIS — Z136 Encounter for screening for cardiovascular disorders: Secondary | ICD-10-CM | POA: Diagnosis not present

## 2022-02-27 DIAGNOSIS — I10 Essential (primary) hypertension: Secondary | ICD-10-CM

## 2022-02-27 DIAGNOSIS — Z87898 Personal history of other specified conditions: Secondary | ICD-10-CM

## 2022-02-27 DIAGNOSIS — E559 Vitamin D deficiency, unspecified: Secondary | ICD-10-CM

## 2022-02-27 DIAGNOSIS — Z0000 Encounter for general adult medical examination without abnormal findings: Secondary | ICD-10-CM | POA: Diagnosis not present

## 2022-02-27 DIAGNOSIS — E782 Mixed hyperlipidemia: Secondary | ICD-10-CM

## 2022-02-27 DIAGNOSIS — Z0001 Encounter for general adult medical examination with abnormal findings: Secondary | ICD-10-CM

## 2022-02-27 DIAGNOSIS — S70261A Insect bite (nonvenomous), right hip, initial encounter: Secondary | ICD-10-CM

## 2022-02-27 DIAGNOSIS — E039 Hypothyroidism, unspecified: Secondary | ICD-10-CM

## 2022-02-27 DIAGNOSIS — Z1389 Encounter for screening for other disorder: Secondary | ICD-10-CM

## 2022-02-27 DIAGNOSIS — M545 Low back pain, unspecified: Secondary | ICD-10-CM

## 2022-02-27 NOTE — Patient Instructions (Signed)
Lyme Disease Lyme disease is an infection that can affect many parts of the body, including the skin, joints, and nervous system. It is a bacterial infection that starts from the bite of an infected tick. Over time, the infection can worsen, and some of the symptoms are similar to the flu. If Lyme disease is not treated, it may cause joint pain, swelling, numbness, problems thinking, fatigue, muscle weakness, and other problems. What are the causes? This condition is caused by bacteria called Borrelia burgdorferi. You can get Lyme disease by being bitten by an infected tick. Only black-legged, or Ixodes, ticks that are infected with the bacteria can cause Lyme disease. The tick must be attached to your skin for a certain period of time to pass along the infection. This is usually 36-48 hours. Deer often carry infected ticks. What increases the risk? The following factors may make you more likely to develop this condition: Living in or visiting these areas in the [place]. The [place] states. [place]. Spending time in wooded or grassy areas. Being outdoors with exposed skin. Camping, gardening, hiking, fishing, hunting, or working outdoors. Failing to remove a tick from your skin. What are the signs or symptoms? Symptoms of this condition may include: Chills and fever. Headache. Fatigue. General achiness. Muscle pain. Joint pain, often in the knees. A round, red rash that surrounds the center of the tick bite. The center of the rash may be blood colored or have tiny blisters. Swollen lymph glands. Stiff neck. How is this diagnosed? This condition is diagnosed based on: Your symptoms and medical history. A physical exam. A blood test. How is this treated? The main treatment for this condition is antibiotic medicine, which is usually taken by mouth (orally). The length of treatment depends on how soon after a tick bite you begin taking the medicine. In some  cases, treatment is necessary for several weeks. If the infection is severe, antibiotics may need to be given through an IV that is inserted into one of your veins. Follow these instructions at home: Take over-the-counter and prescription medicines only as told by your health care provider. Finish all antibiotic medicine, even when you start to feel better. Ask your health care provider about taking a probiotic in between doses of your antibiotic to help avoid an upset stomach or diarrhea. Check with your health care provider before supplementing your treatment. Many alternative therapies have not been proven and may be harmful to you. Keep all follow-up visits as told by your health care provider. This is important. How is this prevented? You can become reinfected if you get another tick bite from an infected tick. Take these steps to help prevent an infection: Cover your skin with light-colored clothing when you are outdoors in the spring and summer months. Spray clothing and skin with bug spray. The spray should be 20-30% DEET. You can also treat clothing with permethrin, and let it dry before you wear it. Do not apply permethrin directly to your skin. Permethrin can also be used to treat camping gear and boots. Always read and follow the instructions that come with a bug spray or insecticide. Avoid wooded, grassy, and shaded areas. Remove yard litter, brush, trash, and plants that attract deer and rodents. Check yourself for ticks when you come indoors. Wash clothing worn each day. Shower after spending time outdoors. Check your pets for ticks before they come inside. If you find a tick attached to your skin: Remove it with tweezers.   Clean your hands and the bite area with rubbing alcohol or soap and water. Dispose of the tick by putting it in rubbing alcohol, putting it in a sealed bag or container, or flushing it down the toilet. You may choose to save the tick in a sealed container if you  wish for it to be tested at a later time. Pregnant women should take special care to avoid tick bites because it is possible that the infection may be passed along to the fetus. Contact a health care provider if: You have symptoms after treatment. You have removed a tick and want to bring it to your health care provider for testing. Get help right away if: You have an irregular heartbeat. You have chest pain. You have nerve pain. Your face feels numb. You develop the following: A stiff neck. A severe headache. Severe nausea and vomiting. Sensitivity to light. Summary Lyme disease is an infection that can affect many parts of the body, including the skin, joints, and nervous system. This condition is caused by bacteria called Borrelia burgdorferi. You can get Lyme disease by being bitten by an infected tick. The main treatment for this condition is antibiotic medicine. This information is not intended to replace advice given to you by your health care provider. Make sure you discuss any questions you have with your health care provider. Document Revised: 12/18/2018 Document Reviewed: 11/12/2018 Elsevier Patient Education  2023 Elsevier Inc.  

## 2022-02-27 NOTE — Progress Notes (Incomplete)
Complete Physical  ***low back pain - gabapentin ***mammogram 10/2021 Colonoscopy 5 years due next year 2024 Dexa in 2021 in 2019 she took Fosomax  **needs [place] for lipoma Referral to GYN    Assessment and Plan:  Bilateral hand pain ? From OA + nodules at PIP/DIP- stress ball Worse after food versus at a break- normal pulses bilateral If not better will get RA labs Check labs  Screening for cardiovascular condition -     EKG 12-Lead -     [place], RETROPERITNL ABD,  LTD  Screening for hematuria or proteinuria -     Urinalysis, Routine w reflex microscopic -     Microalbumin / creatinine urine ratio  History of smoking -     [place], RETROPERITNL ABD,  LTD  Screening, anemia, deficiency, iron -     Iron, Total/Total Iron Binding Cap -     Vitamin B12  Hypothyroidism, unspecified type Hypothyroidism-check TSH level, continue medications the same, reminded to take on an empty stomach 30-19mns before food. SWITCH TO A ONCE A DAY BASED ON THIS LAB -     TSH  Mixed hyperlipidemia -continue medications, check lipids, decrease fatty foods, increase activity.  -     Lipid panel  History of prediabetes Monitor weight  Medication management -     Magnesium  Vitamin D deficiency -     VITAMIN D 25 Hydroxy (Vit-D Deficiency, Fractures)  Encounter for general adult medical examination with abnormal findings 1 year  BMI 24.0-24.9, adult Monitor  Osteoporosis Continue fosamax, DEXA next year    Discussed med's effects and SE's. Screening labs and tests as requested with regular follow-up as recommended. Over 40 minutes of exam, counseling, chart review, and complex, high level critical decision making was performed this visit.   HPI  61y.o. female  presents for a complete physical and follow up for has Hypothyroidism; Vitamin D deficiency; Mixed hyperlipidemia; History of prediabetes; Medication management; and Osteoporosis on their problem list..  She has vaginal dryness,  overdue to see GYN, will go. Dr. LShela Leff moved, tracked her referreal.  MTheone Stanley not UTD on pap, + menopause, no hysterectomy.    She has 9-10 tick bites this past saturday working in the yard.    She states after she eats she will get occ hand cramping, her fingers will be in odd position, has been happending more often.   Her blood pressure has been controlled at home, today their BP is   She does workout, walking helps her legs. She denies chest pain, shortness of breath, dizziness.   She is on cholesterol medication and denies myalgias. Her cholesterol is at goal. The cholesterol last visit was:   Lab Results  Component Value Date   CHOL 220 (H) 08/14/2020   HDL 69 08/14/2020   LDLCALC 128 (H) 08/14/2020   TRIG 119 08/14/2020   CHOLHDL 3.2 08/14/2020   Last A1C in the office was:  Lab Results  Component Value Date   HGBA1C 5.4 02/04/2019   Patient is on Vitamin D supplement.   Lab Results  Component Value Date   VD25OH 48 08/14/2020     She is on thyroid medication. Her medication was changed last visit 1/2 TABLET EVERY DAY BUT 1 TABLET 2 DAYS A WEEK.   Lab Results  Component Value Date   TSH 0.99 08/14/2020  .  Current Medications:   Current Outpatient Medications (Endocrine & Metabolic):  .  levothyroxine (SYNTHROID) 125 MCG tablet, Take  1  tablet  Daily  on an empty stomach with only water for 30 minutes & no Antacid meds, Calcium or Magnesium for 4 hours & avoid Biotin      Current Outpatient Medications (Other):  .  cholecalciferol (VITAMIN D) 1000 UNITS tablet, Take 1,000 Units by mouth daily. Marland Kitchen  gabapentin (NEURONTIN) 100 MG capsule, Take  1 to 2 capsules  3 x /day   for Back Pain  Allergies:  Allergies  Allergen Reactions  . Codeine Nausea And Vomiting   Medical History:  She has Hypothyroidism; Vitamin D deficiency; Mixed hyperlipidemia; History of prediabetes; Medication management; and Osteoporosis on their problem list.  Health Maintenance:    Immunization History  Administered Date(s) Administered  . Influenza,inj,quad, With Preservative 07/10/2018  . PFIZER(Purple Top)SARS-COV-2 Vaccination 11/20/2019, 12/14/2019  . PPD Test 08/09/2013, 09/18/2015  . Pneumococcal Polysaccharide-23 07/29/2012  . Td 04/15/2005  . Tdap 09/18/2015, 11/06/2018  . Zoster Recombinat (Shingrix) 08/21/2018, 11/06/2018   Health Maintenance  Topic Date Due  . PAP SMEAR-Modifier  Never done  . COVID-19 Vaccine (3 - Pfizer risk series) 01/11/2020  . DEXA SCAN  03/21/2022  . INFLUENZA VACCINE  04/09/2022  . MAMMOGRAM  10/22/2022  . COLONOSCOPY (Pts 45-19yr Insurance coverage will need to be confirmed)  04/21/2023  . TETANUS/TDAP  11/06/2028  . Hepatitis C Screening  Completed  . HIV Screening  Completed  . Zoster Vaccines- Shingrix  Completed  . HPV VACCINES  Aged Out    UTD   Pap: DUE will go see GYN MGM: 09/2019 [facility] DEXA: 02/2018 right hip -2.5- has been on fosamax x 2019 OVERDUE Colonoscopy: 04/2018 Dr. DLoletha CarrowEGD: CXR 2011  Patient Care Team: MUnk Pinto MD as PCP - General (Internal Medicine)  Surgical History:  She has a past surgical history that includes Biopsy thyroid (2009) and Wisdom tooth extraction. Family History:  Herfamily history includes Cancer (age of onset: 846 in her paternal uncle; Dementia in her mother; Lymphoma (age of onset: 771 in her father; Lymphoma (age of onset: 841 in her paternal uncle. Social History:  She reports that she has quit smoking. She has never used smokeless tobacco. She reports current alcohol use of about 2.0 standard drinks of alcohol per week. She reports that she does not use drugs.  Review of Systems: Review of Systems  Constitutional: Negative.   HENT: Negative.    Eyes: Negative.   Respiratory: Negative.    Cardiovascular: Negative.   Gastrointestinal: Negative.   Genitourinary: Negative.   Musculoskeletal: Negative.   Skin: Negative.   Neurological: Negative.    Endo/Heme/Allergies: Negative.   Psychiatric/Behavioral: Negative.      Physical Exam: Estimated body mass index is 25.47 kg/m as calculated from the following:   Height as of 01/24/22: 5' 8.5" (1.74 m).   Weight as of 01/24/22: 170 lb (77.1 kg). There were no vitals taken for this visit. General Appearance: Well nourished, in no apparent distress.  Eyes: PERRLA, EOMs, conjunctiva no swelling or erythema, normal fundi and vessels.  Sinuses: No Frontal/maxillary tenderness  ENT/Mouth: bilateral ears with cerumen impaction. Good dentition. No erythema, swelling, or exudate on post pharynx. Tonsils not swollen or erythematous. Hearing decreased slightly Neck: posterior left hair line with erythema, has been there years. No scaly or itching. Supple, thyroid normal. No bruits  Respiratory: Respiratory effort normal, BS equal bilaterally without rales, rhonchi, wheezing or stridor.  Cardio: RRR without murmurs, rubs or gallops. Brisk peripheral pulses without edema.  Chest: symmetric, with normal excursions and percussion.  Breasts: Symmetric, without lumps, nipple discharge, retractions.  Abdomen: Soft, nontender, no guarding, rebound, hernias, masses, or organomegaly.  Lymphatics: Non tender without lymphadenopathy.  Genitourinary: defer Musculoskeletal: Full ROM all peripheral extremities,5/5 strength, and normal gait.  Skin: right temporal area with 3 small white nodules/seb cyst.  Warm, dry without rashes, lesions, ecchymosis. Neuro: Cranial nerves intact, reflexes equal bilaterally. Normal muscle tone, no cerebellar symptoms. Sensation intact.  Psych: Awake and oriented X 3, normal affect, Insight and Judgment appropriate.   EKG: WNL no ST changes. AORTA SCAN: defer  Sharlyne Koeneman 3:13 PM [facility]

## 2022-02-28 ENCOUNTER — Other Ambulatory Visit: Payer: Self-pay | Admitting: Nurse Practitioner

## 2022-02-28 DIAGNOSIS — R7989 Other specified abnormal findings of blood chemistry: Secondary | ICD-10-CM

## 2022-02-28 DIAGNOSIS — E039 Hypothyroidism, unspecified: Secondary | ICD-10-CM

## 2022-02-28 DIAGNOSIS — Z79899 Other long term (current) drug therapy: Secondary | ICD-10-CM

## 2022-02-28 LAB — CBC WITH DIFFERENTIAL/PLATELET
Absolute Monocytes: 446 cells/uL (ref 200–950)
Basophils Absolute: 48 cells/uL (ref 0–200)
Basophils Relative: 1 %
Eosinophils Absolute: 139 cells/uL (ref 15–500)
Eosinophils Relative: 2.9 %
HCT: 41.9 % (ref 35.0–45.0)
Hemoglobin: 14.2 g/dL (ref 11.7–15.5)
Lymphs Abs: 1234 cells/uL (ref 850–3900)
MCH: 30.4 pg (ref 27.0–33.0)
MCHC: 33.9 g/dL (ref 32.0–36.0)
MCV: 89.7 fL (ref 80.0–100.0)
MPV: 10.3 fL (ref 7.5–12.5)
Monocytes Relative: 9.3 %
Neutro Abs: 2933 cells/uL (ref 1500–7800)
Neutrophils Relative %: 61.1 %
Platelets: 233 10*3/uL (ref 140–400)
RBC: 4.67 10*6/uL (ref 3.80–5.10)
RDW: 12.4 % (ref 11.0–15.0)
Total Lymphocyte: 25.7 %
WBC: 4.8 10*3/uL (ref 3.8–10.8)

## 2022-02-28 LAB — HEMOGLOBIN A1C
Hgb A1c MFr Bld: 5.5 % of total Hgb (ref <5.7)
Mean Plasma Glucose: 111 mg/dL
eAG (mmol/L): 6.2 mmol/L

## 2022-02-28 LAB — COMPLETE METABOLIC PANEL WITH GFR
AG Ratio: 1.9 (calc) (ref 1.0–2.5)
ALT: 28 U/L (ref 6–29)
AST: 28 U/L (ref 10–35)
Albumin: 4.7 g/dL (ref 3.6–5.1)
Alkaline phosphatase (APISO): 81 U/L (ref 37–153)
BUN: 12 mg/dL (ref 7–25)
CO2: 28 mmol/L (ref 20–32)
Calcium: 9.9 mg/dL (ref 8.6–10.4)
Chloride: 102 mmol/L (ref 98–110)
Creat: 0.86 mg/dL (ref 0.50–1.03)
Globulin: 2.5 g/dL (calc) (ref 1.9–3.7)
Glucose, Bld: 88 mg/dL (ref 65–99)
Potassium: 4.5 mmol/L (ref 3.5–5.3)
Sodium: 140 mmol/L (ref 135–146)
Total Bilirubin: 0.8 mg/dL (ref 0.2–1.2)
Total Protein: 7.2 g/dL (ref 6.1–8.1)
eGFR: 78 mL/min/{1.73_m2} (ref >=60)

## 2022-02-28 LAB — URINALYSIS, ROUTINE W REFLEX MICROSCOPIC
Bilirubin Urine: NEGATIVE
Glucose, UA: NEGATIVE
Hgb urine dipstick: NEGATIVE
Ketones, ur: NEGATIVE
Leukocytes,Ua: NEGATIVE
Nitrite: NEGATIVE
Protein, ur: NEGATIVE
Specific Gravity, Urine: 1.008 (ref 1.001–1.035)
pH: 6.5 (ref 5.0–8.0)

## 2022-02-28 LAB — B. BURGDORFI ANTIBODIES: B burgdorferi Ab IgG+IgM: <0.9 index

## 2022-02-28 LAB — TSH: TSH: 0.23 mIU/L — ABNORMAL LOW (ref 0.40–4.50)

## 2022-02-28 LAB — LIPID PANEL
Cholesterol: 218 mg/dL — ABNORMAL HIGH (ref <200)
HDL: 70 mg/dL (ref >=50)
LDL Cholesterol (Calc): 126 mg/dL (calc) — ABNORMAL HIGH
Non-HDL Cholesterol (Calc): 148 mg/dL (calc) — ABNORMAL HIGH (ref <130)
Total CHOL/HDL Ratio: 3.1 (calc) (ref <5.0)
Triglycerides: 114 mg/dL (ref <150)

## 2022-02-28 LAB — VITAMIN D 25 HYDROXY (VIT D DEFICIENCY, FRACTURES): Vit D, 25-Hydroxy: 68 ng/mL (ref 30–100)

## 2022-03-01 ENCOUNTER — Other Ambulatory Visit: Payer: Self-pay | Admitting: Nurse Practitioner

## 2022-03-01 DIAGNOSIS — R7989 Other specified abnormal findings of blood chemistry: Secondary | ICD-10-CM

## 2022-03-01 DIAGNOSIS — E039 Hypothyroidism, unspecified: Secondary | ICD-10-CM

## 2022-03-13 ENCOUNTER — Other Ambulatory Visit: Payer: Self-pay

## 2022-03-13 DIAGNOSIS — E039 Hypothyroidism, unspecified: Secondary | ICD-10-CM

## 2022-03-13 MED ORDER — LEVOTHYROXINE SODIUM 125 MCG PO TABS: ORAL_TABLET | ORAL | 0 refills | Status: DC

## 2022-04-08 ENCOUNTER — Other Ambulatory Visit: Payer: Self-pay | Admitting: Nurse Practitioner

## 2022-04-08 DIAGNOSIS — E039 Hypothyroidism, unspecified: Secondary | ICD-10-CM

## 2022-05-20 ENCOUNTER — Other Ambulatory Visit: Payer: Self-pay | Admitting: Internal Medicine

## 2022-05-20 DIAGNOSIS — E039 Hypothyroidism, unspecified: Secondary | ICD-10-CM

## 2022-05-20 MED ORDER — LEVOTHYROXINE SODIUM 125 MCG PO TABS: ORAL_TABLET | ORAL | 1 refills | Status: DC

## 2022-08-21 ENCOUNTER — Ambulatory Visit (INDEPENDENT_AMBULATORY_CARE_PROVIDER_SITE_OTHER): Payer: No Typology Code available for payment source | Admitting: Plastic Surgery

## 2022-08-21 ENCOUNTER — Encounter: Payer: Self-pay | Admitting: Plastic Surgery

## 2022-08-21 VITALS — BP 154/67 | HR 67 | Ht 68.5 in | Wt 164.4 lb

## 2022-08-21 DIAGNOSIS — D489 Neoplasm of uncertain behavior, unspecified: Secondary | ICD-10-CM

## 2022-08-21 DIAGNOSIS — R222 Localized swelling, mass and lump, trunk: Secondary | ICD-10-CM | POA: Diagnosis not present

## 2022-08-21 NOTE — Progress Notes (Signed)
Referring Provider Unk Pinto, MD [address] [place],  [place] [address]   CC:  Chief Complaint  Patient presents with   Consult           Jodi Graham is an 61 y.o. female.  HPI: Jodi Graham is a 61 year old female who presents today with complaints of a lipoma in the left paraspinal region which she says is causing her localized pain and pain which radiates around her back and into her chest.  She said her pain started initially with an acute trauma and shortly after she noted a lipoma in the region.  She has had an MRI which shows a very small subcutaneous mass consistent with a lipoma in the area of concern.  Allergies  Allergen Reactions   Codeine Nausea And Vomiting    Outpatient Encounter Medications as of 08/21/2022  Medication Sig   cholecalciferol (VITAMIN D) 1000 UNITS tablet Take 1,000 Units by mouth daily.   Cyanocobalamin (B-12 PO) Take by mouth.   levothyroxine (SYNTHROID) 125 MCG tablet Take  1 tablet  Daily  on an empty stomach with only water for 30 minutes & no Antacid meds, Calcium or Magnesium for 4 hours & avoid Biotin   Multiple Vitamin (MULTIVITAMIN) tablet Take 1 tablet by mouth daily. 2-3 times a week   [DISCONTINUED] gabapentin (NEURONTIN) 100 MG capsule Take  1 to 2 capsules  3 x /day   for Back Pain (Patient not taking: Reported on 02/27/2022)   No facility-administered encounter medications on file as of 08/21/2022.     Past Medical History:  Diagnosis Date   DDD (degenerative disc disease), lumbar    back and neck  issues    Hypothyroidism     Past Surgical History:  Procedure Laterality Date   BIOPSY THYROID  2009   Negative   WISDOM TOOTH EXTRACTION      Family History  Problem Relation Age of Onset   Dementia Mother        from TBI with car wreck   Lymphoma Father 25       mass lower abdominal region but not stomach cancer    Lymphoma Paternal Uncle 68   Cancer Paternal Uncle 81       leukemia   Colon  cancer Neg Hx    Colon polyps Neg Hx    Esophageal cancer Neg Hx    Rectal cancer Neg Hx    Stomach cancer Neg Hx     Social History   Social History Narrative   Not on file     Review of Systems General: Denies fevers, chills, weight loss CV: Denies chest pain, shortness of breath, palpitations Skin: Small palpable mass on the left mid back over the paraspinous muscles.  She states that she has pain when this is palpated.  Physical Exam    08/21/2022   10:12 AM 02/27/2022    3:10 PM 01/24/2022    2:49 PM  Vitals with BMI  Height 5' 8.5" 5' 7.5" 5' 8.5"  Weight 164 lbs 6 oz 166 lbs 3 oz 170 lbs  BMI 24.63 99.37 16.96  Systolic [phone]  Diastolic 67 70 78  Pulse 67 70 63    General:  No acute distress,  Alert and oriented, Non-Toxic, Normal speech and affect Integument: The patient has a mass which I can palpate which is approximately 1 cm in size.  Physical exam exam is consistent with a lipoma. Mammogram: Not applicable Assessment/Plan Subcutaneous mass:  The patient I discussed this mass at great length.  I am sure that I can easily excise the mass however given how small it is in the location on her back and the MRI which shows that it may be attached to the muscle I think it would be safer to do this in the operating room.  I have also told her that in my personal experience I have never seen a lipoma which caused as much pain as she seems to have.  Think it is unlikely that removal of the lipoma will result in improvement of her pain.  It is reasonable to remove the lipoma for pathologic diagnosis to ensure that there is no worrisome pathology.  It is also reasonable to remove it for her peace of mind.  We discussed the risks of bleeding infection and the possibility that I may not be able to find the lipoma when she is asleep.  She understands all of this and request that I proceed.  [name] 08/21/2022, 11:03 AM

## 2022-09-11 ENCOUNTER — Encounter: Payer: Self-pay | Admitting: Obstetrics & Gynecology

## 2022-09-23 ENCOUNTER — Other Ambulatory Visit (HOSPITAL_COMMUNITY)
Admission: RE | Admit: 2022-09-23 | Discharge: 2022-09-23 | Disposition: A | Discharge status: Home / Self Care | Payer: No Typology Code available for payment source | Source: Ambulatory Visit | Attending: Obstetrics & Gynecology | Admitting: Obstetrics & Gynecology

## 2022-09-23 ENCOUNTER — Encounter: Payer: Self-pay | Admitting: Obstetrics & Gynecology

## 2022-09-23 ENCOUNTER — Ambulatory Visit (INDEPENDENT_AMBULATORY_CARE_PROVIDER_SITE_OTHER): Payer: No Typology Code available for payment source | Admitting: Obstetrics & Gynecology

## 2022-09-23 VITALS — BP 122/80 | Ht 67.0 in | Wt 166.0 lb

## 2022-09-23 DIAGNOSIS — Z01419 Encounter for gynecological examination (general) (routine) without abnormal findings: Secondary | ICD-10-CM

## 2022-09-23 DIAGNOSIS — Z780 Asymptomatic menopausal state: Secondary | ICD-10-CM | POA: Diagnosis not present

## 2022-09-23 DIAGNOSIS — M810 Age-related osteoporosis without current pathological fracture: Secondary | ICD-10-CM

## 2022-09-23 NOTE — Progress Notes (Signed)
Jodi Graham 18-Nov-1960 [id number]   History:    62 y.o. [id number] Married  RP:  New patient presenting for annual gyn exam   HPI: Postmenopause, well on no HRT.  No PMB.  No pelvic pain.  No pain with IC.  No h/o abnormal Pap.  Last Pap Neg with me at [place] 8 years ago.  Pap reflex today.  Breasts normal.  Mammo Neg 10/2021.  Bone Density Osteoporosis stable with T-Score -2.5 in 03/2020.  Stopped Fosamax at that time.  Will schedule a Bone Density now at [facility]. Exercising, walking.  Taking Vit D supplement.  Good Ca++ intake in nutrition.  [place] 2019.  Health Labs with Fam MD.    Past medical history,surgical history, family history and social history were all reviewed and documented in the EPIC chart.  Gynecologic History No LMP recorded. Patient is postmenopausal.  Obstetric History OB History  Gravida Para Term Preterm AB Living  1 0 0 0 1 0  SAB IAB Ectopic Multiple Live Births  1 0 0 0 0    # Outcome Date GA Lbr Len/2nd Weight Sex Delivery Anes PTL Lv  1 SAB              ROS: A ROS was performed and pertinent positives and negatives are included in the history. GENERAL: No fevers or chills. HEENT: No change in vision, no earache, sore throat or sinus congestion. NECK: No pain or stiffness. CARDIOVASCULAR: No chest pain or pressure. No palpitations. PULMONARY: No shortness of breath, cough or wheeze. GASTROINTESTINAL: No abdominal pain, nausea, vomiting or diarrhea, melena or bright red blood per rectum. GENITOURINARY: No urinary frequency, urgency, hesitancy or dysuria. MUSCULOSKELETAL: No joint or muscle pain, no back pain, no recent trauma. DERMATOLOGIC: No rash, no itching, no lesions. ENDOCRINE: No polyuria, polydipsia, no heat or cold intolerance. No recent change in weight. HEMATOLOGICAL: No anemia or easy bruising or bleeding. NEUROLOGIC: No headache, seizures, numbness, tingling or weakness. PSYCHIATRIC: No depression, no loss of interest in normal activity or change  in sleep pattern.     Exam:   BP 122/80 (BP Location: Right Arm, Patient Position: Sitting, Cuff Size: Normal)   Ht '5\' 7"'$  (1.702 m)   Wt 166 lb (75.3 kg)   BMI 26.00 kg/m   Body mass index is 26 kg/m.  General appearance : Well developed well nourished female. No acute distress HEENT: Eyes: no retinal hemorrhage or exudates,  Neck supple, trachea midline, no carotid bruits, no thyroidmegaly Lungs: Clear to auscultation, no rhonchi or wheezes, or rib retractions  Heart: Regular rate and rhythm, no murmurs or gallops Breast:Examined in sitting and supine position were symmetrical in appearance, no palpable masses or tenderness,  no skin retraction, no nipple inversion, no nipple discharge, no skin discoloration, no axillary or supraclavicular lymphadenopathy Abdomen: no palpable masses or tenderness, no rebound or guarding Extremities: no edema or skin discoloration or tenderness  Pelvic: Vulva: Normal             Vagina: No gross lesions or discharge  Cervix: Normal, no gross lesions or discharge.  Pap reflex done.  Uterus  AV, normal size, shape and consistency, non-tender and mobile  Adnexa  Without masses or tenderness  Anus: Normal   Assessment/Plan:  62 y.o. female for annual exam   1. Encounter for routine gynecological examination with Papanicolaou smear of cervix Postmenopause, well on no HRT.  No PMB.  No pelvic pain.  No pain with IC.  No h/o  abnormal Pap.  Last Pap Neg with me at [place] 8 years ago.  Pap reflex today.  Breasts normal.  Mammo Neg 10/2021.  Bone Density Osteoporosis stable with T-Score -2.5 in 03/2020.  Stopped Fosamax at that time.  Will schedule a Bone Density now at [facility]. Exercising, walking.  Taking Vit D supplement.  Good Ca++ intake in nutrition.  [place] 2019.  Health Labs with Fam MD.   - Cytology - PAP( [place])  2. Postmenopause Postmenopause, well on no HRT.  No PMB.  No pelvic pain.  No pain with IC.   3. Age-related osteoporosis  without current pathological fracture  Bone Density Osteoporosis stable with T-Score -2.5 in 03/2020.  Stopped Fosamax at that time.  Will schedule a Bone Density now at [facility]. Managed by Fam MD.  Exercising, walking.  Taking Vit D supplement.  Good Ca++ intake in nutrition.  Princess Bruins MD, 2:32 PM

## 2022-09-24 LAB — CYTOLOGY - PAP: Diagnosis: NEGATIVE

## 2022-10-21 ENCOUNTER — Telehealth: Payer: Self-pay | Admitting: *Deleted

## 2022-10-21 NOTE — Telephone Encounter (Signed)
Auth submitted via [email or web address]

## 2022-10-22 ENCOUNTER — Telehealth: Payer: Self-pay | Admitting: *Deleted

## 2022-10-22 NOTE — Telephone Encounter (Signed)
Per fax back from [facility] no auth required for CPT 21930, 21932  LVM for patient to scheduled sx

## 2022-10-28 LAB — HM MAMMOGRAPHY

## 2022-10-29 ENCOUNTER — Encounter: Payer: Self-pay | Admitting: Internal Medicine

## 2022-10-30 ENCOUNTER — Encounter: Payer: Self-pay | Admitting: Plastic Surgery

## 2022-10-30 NOTE — Telephone Encounter (Signed)
Please see message. °

## 2022-11-27 ENCOUNTER — Other Ambulatory Visit: Payer: Self-pay

## 2022-11-27 DIAGNOSIS — E039 Hypothyroidism, unspecified: Secondary | ICD-10-CM

## 2022-11-27 MED ORDER — LEVOTHYROXINE SODIUM 125 MCG PO TABS: ORAL_TABLET | ORAL | 1 refills | Status: DC

## 2023-02-05 ENCOUNTER — Encounter: Payer: Self-pay | Admitting: Gastroenterology

## 2023-03-03 ENCOUNTER — Encounter: Payer: No Typology Code available for payment source | Admitting: Nurse Practitioner

## 2023-03-10 ENCOUNTER — Ambulatory Visit (INDEPENDENT_AMBULATORY_CARE_PROVIDER_SITE_OTHER): Payer: No Typology Code available for payment source | Admitting: Nurse Practitioner

## 2023-03-10 ENCOUNTER — Encounter: Payer: Self-pay | Admitting: Nurse Practitioner

## 2023-03-10 VITALS — BP 122/78 | HR 67 | Temp 97.5°F | Ht 68.0 in | Wt 163.4 lb

## 2023-03-10 DIAGNOSIS — Z136 Encounter for screening for cardiovascular disorders: Secondary | ICD-10-CM

## 2023-03-10 DIAGNOSIS — E559 Vitamin D deficiency, unspecified: Secondary | ICD-10-CM

## 2023-03-10 DIAGNOSIS — M545 Low back pain, unspecified: Secondary | ICD-10-CM

## 2023-03-10 DIAGNOSIS — Z87898 Personal history of other specified conditions: Secondary | ICD-10-CM

## 2023-03-10 DIAGNOSIS — Z0001 Encounter for general adult medical examination with abnormal findings: Secondary | ICD-10-CM

## 2023-03-10 DIAGNOSIS — I10 Essential (primary) hypertension: Secondary | ICD-10-CM

## 2023-03-10 DIAGNOSIS — E039 Hypothyroidism, unspecified: Secondary | ICD-10-CM

## 2023-03-10 DIAGNOSIS — Z0000 Encounter for general adult medical examination without abnormal findings: Secondary | ICD-10-CM | POA: Diagnosis not present

## 2023-03-10 DIAGNOSIS — M810 Age-related osteoporosis without current pathological fracture: Secondary | ICD-10-CM

## 2023-03-10 DIAGNOSIS — Z1211 Encounter for screening for malignant neoplasm of colon: Secondary | ICD-10-CM

## 2023-03-10 DIAGNOSIS — Z1389 Encounter for screening for other disorder: Secondary | ICD-10-CM

## 2023-03-10 DIAGNOSIS — Z79899 Other long term (current) drug therapy: Secondary | ICD-10-CM

## 2023-03-10 DIAGNOSIS — E538 Deficiency of other specified B group vitamins: Secondary | ICD-10-CM

## 2023-03-10 DIAGNOSIS — E782 Mixed hyperlipidemia: Secondary | ICD-10-CM

## 2023-03-10 NOTE — Progress Notes (Signed)
Complete Physical  Assessment and Plan:  Encounter for general adult medical examination with abnormal findings Due annually Health maintenance reviewed   Low back pain without sciatica, unspecified back pain laterality, unspecified chronicity Cx 5/6 and L5  DJD Continue Gabapentin. Stay well hydrated. Rest when flared. Alternate ice/heat.  Continue to follow with Dr. Jerl Santos  Hypothyroidism, unspecified type Controlled. Continue Levothyroxine. Reminded to take on an empty stomach 30-19mins before food.  Stop any Biotin Supplement 48-72 hours before next TSH level to reduce the risk of falsely low TSH levels. Continue to monitor.    History of prediabetes Education: Reviewed 'ABCs' of diabetes management  Discussed goals to be met and/or maintained include A1C (<7) Blood pressure (<130/80) Cholesterol (LDL <70) Continue Eye Exam yearly  Continue Dental Exam Q6 mo Discussed dietary recommendations Discussed Physical Activity recommendations Check A1C  Age-related osteoporosis without current pathological fracture She stopped Fosamax d/t reports of it showing no improvement to T-score. Due - will obtain from GYN Pursue a combination of weight-bearing exercises and strength training. Patients with severe mobility impairment should be referred for physical therapy. Advised on fall prevention measures including proper lighting in all rooms, removal of area rugs and floor clutter, use of walking devices as deemed appropriate, avoidance of uneven walking surfaces. Smoking cessation and moderate alcohol consumption if applicable Consume 800 to 1000 IU of vitamin D daily with a goal vitamin D serum value of 30 ng/mL or higher. Aim for 1000 to 1200 mg of elemental calcium daily through supplements and/or dietary sources.  Vitamin D deficiency Continue supplement for goal of 60-100 Monitor Vitamin D levels  Mixed hyperlipidemia Discussed lifestyle modifications. Recommended  diet heavy in fruits and veggies, omega 3's. Decrease consumption of animal meats, cheeses, and dairy products. Remain active and exercise as tolerated. Continue to monitor. Check lipids/TSH  Medication management All medications discussed and reviewed in full. All questions and concerns regarding medications addressed.    Screening for heart disease EKG 12-Lead  Screening for hematuria or proteinuria Urinalysis, Routine w reflex microscopic  B12 deficiency Monitor levels   Screening for colon cancer Colonoscopy due 04/2023 opted for cologuard.    Orders Placed This Encounter  Procedures   CBC with Differential/Platelet   COMPLETE METABOLIC PANEL WITH GFR   Magnesium   Lipid panel   TSH   Hemoglobin A1c   Insulin, random   VITAMIN D 25 Hydroxy (Vit-D Deficiency, Fractures)   Urinalysis, Routine w reflex microscopic   Microalbumin / creatinine urine ratio   Vitamin B12   Cologuard   EKG 12-Lead    Notify office for further evaluation and treatment, questions or concerns if any reported s/s fail to improve.   The patient was advised to call back or seek an in-person evaluation if any symptoms worsen or if the condition fails to improve as anticipated.   Further disposition pending results of labs. Discussed med's effects and SE's.    I discussed the assessment and treatment plan with the patient. The patient was provided an opportunity to ask questions and all were answered. The patient agreed with the plan and demonstrated an understanding of the instructions.  Discussed med's effects and SE's. Screening labs and tests as requested with regular follow-up as recommended.  I provided 40 minutes of face-to-face time during this encounter including counseling, chart review, and critical decision making was preformed.  Today's Plan of Care is based on a patient-centered health care approach known as shared decision making - the decisions, tests  and treatments allow for  patient preferences and values to be balanced with clinical evidence.       HPI  62 y.o. female  presents for a complete physical and follow up for has Hypothyroidism; Vitamin D deficiency; Mixed hyperlipidemia; History of prediabetes; Medication management; and Osteoporosis on their problem list..  Overall she reports doing well.  She was concerned for an area on her lower back that is raised and mushy feeling.  She has had MR lumbar spine in 2014 that she reports has shown no concerning symptoms, however, she feels as though this is attributing to her low back pain.  She has a hx/o LBP predating since about 2000 . Has seen Dr Jerl Santos in the past.  She was recently placed on Gabapentin by Dr. Posey Boyer with mild relief.  She reports having dry needling and states now the pain has completely subsided.  Her blood pressure has been controlled at home, today their BP is   She does workout, walking helps her legs. She denies chest pain, shortness of breath, dizziness.   She is on cholesterol medication and denies myalgias. Her cholesterol is at goal. The cholesterol last visit was:   Lab Results  Component Value Date   CHOL 218 (H) 02/27/2022   HDL 70 02/27/2022   LDLCALC 126 (H) 02/27/2022   TRIG 114 02/27/2022   CHOLHDL 3.1 02/27/2022   Last Z6X in the office was:  Lab Results  Component Value Date   HGBA1C 5.5 02/27/2022   Patient is on Vitamin D supplement.   Lab Results  Component Value Date   VD25OH 68 02/27/2022     She is on thyroid medication. Her medication was changed last visit 1/2 TABLET EVERY DAY BUT 1 TABLET 2 DAYS A WEEK.   Lab Results  Component Value Date   TSH 0.23 (L) 02/27/2022  .  She has a hx of borderline low B12.  She is not currently on a supplement Lab Results  Component Value Date   VITAMINB12 354 02/10/2020   Current Medications:   Current Outpatient Medications (Endocrine & Metabolic):    levothyroxine (SYNTHROID) 125 MCG tablet, Take  1 tablet   Daily  on an empty stomach with only water for 30 minutes & no Antacid meds, Calcium or Magnesium for 4 hours & avoid Biotin     Current Outpatient Medications (Hematological):    Cyanocobalamin (B-12 PO), Take by mouth.  Current Outpatient Medications (Other):    cholecalciferol (VITAMIN D) 1000 UNITS tablet, Take 1,000 Units by mouth daily.   Multiple Vitamin (MULTIVITAMIN) tablet, Take 1 tablet by mouth daily. 2-3 times a week  Allergies:  Allergies  Allergen Reactions   Codeine Nausea And Vomiting   Medical History:  She has Hypothyroidism; Vitamin D deficiency; Mixed hyperlipidemia; History of prediabetes; Medication management; and Osteoporosis on their problem list.  Health Maintenance:   Immunization History  Administered Date(s) Administered   Influenza,inj,quad, With Preservative 07/10/2018   PFIZER(Purple Top)SARS-COV-2 Vaccination 11/20/2019, 12/14/2019   PPD Test 08/09/2013, 09/18/2015   Pneumococcal Polysaccharide-23 07/29/2012   Td 04/15/2005   Tdap 09/18/2015, 11/06/2018   Zoster Recombinant(Shingrix) 08/21/2018, 11/06/2018   Health Maintenance  Topic Date Due   COVID-19 Vaccine (3 - Pfizer risk series) 01/11/2020   DEXA SCAN  03/21/2022   Colonoscopy  04/21/2023   INFLUENZA VACCINE  04/10/2023   MAMMOGRAM  10/29/2023   PAP SMEAR-Modifier  09/23/2025   DTaP/Tdap/Td (4 - Td or Tdap) 11/06/2028   Hepatitis C Screening  Completed   HIV Screening  Completed   Zoster Vaccines- Shingrix  Completed   HPV VACCINES  Aged Out    Pap: DUE - she is to follow with GYN MGM: 10/2022 [facility] DEXA: Due - to be obtained by GYN Colonoscopy: 04/2018 Dr. Myrtie Neither Due 04/2023 EGD: CXR 2011  Dentist:  Every 6 years [facility]:  Every year  Patient Care Team: Lucky Cowboy, MD as PCP - General (Internal Medicine)  Surgical History:  She has a past surgical history that includes Biopsy thyroid (2009) and Wisdom tooth extraction. Family History:  Herfamily  history includes Cancer (age of onset: 21) in her paternal uncle; Dementia in her mother; Lymphoma (age of onset: 70) in her father; Lymphoma (age of onset: 68) in her paternal uncle. Social History:  She reports that she has quit smoking. She has never used smokeless tobacco. She reports current alcohol use of about 2.0 standard drinks of alcohol per week. She reports that she does not use drugs.  Review of Systems: Review of Systems  Constitutional: Negative.   HENT: Negative.    Eyes: Negative.   Respiratory: Negative.    Cardiovascular: Negative.   Gastrointestinal: Negative.   Genitourinary: Negative.   Musculoskeletal: Negative.   Skin: Negative.   Neurological: Negative.   Endo/Heme/Allergies: Negative.   Psychiatric/Behavioral: Negative.      Physical Exam: Estimated body mass index is 26 kg/m as calculated from the following:   Height as of 09/23/22: 5\' 7"  (1.702 m).   Weight as of 09/23/22: 166 lb (75.3 kg). There were no vitals taken for this visit. General Appearance: Well nourished, in no apparent distress.  Eyes: PERRLA, EOMs, conjunctiva no swelling or erythema, normal fundi and vessels.  Sinuses: No Frontal/maxillary tenderness  ENT/Mouth: bilateral ears with cerumen impaction. Good dentition. No erythema, swelling, or exudate on post pharynx. Tonsils not swollen or erythematous. Hearing decreased slightly Neck: posterior left hair line with erythema, has been there years. No scaly or itching. Supple, thyroid normal. No bruits  Respiratory: Respiratory effort normal, BS equal bilaterally without rales, rhonchi, wheezing or stridor.  Cardio: RRR without murmurs, rubs or gallops. Brisk peripheral pulses without edema.  Chest: symmetric, with normal excursions and percussion.  Breasts: Symmetric, without lumps, nipple discharge, retractions.  Abdomen: Soft, nontender, no guarding, rebound, hernias, masses, or organomegaly.  Lymphatics: Non tender without  lymphadenopathy.  Genitourinary: defer Musculoskeletal: Full ROM all peripheral extremities,5/5 strength, and normal gait.  Skin: right temporal area with 3 small white nodules/seb cyst.  Warm, dry without rashes, lesions, ecchymosis. Neuro: Cranial nerves intact, reflexes equal bilaterally. Normal muscle tone, no cerebellar symptoms. Sensation intact.  Psych: Awake and oriented X 3, normal affect, Insight and Judgment appropriate.   EKG: WNL no ST changes.   Jodi Graham 3:02 PM  [facility]

## 2023-03-11 LAB — CBC WITH DIFFERENTIAL/PLATELET
Absolute Monocytes: 455 cells/uL (ref 200–950)
Basophils Absolute: 39 cells/uL (ref 0–200)
Basophils Relative: 0.6 %
Eosinophils Absolute: 189 cells/uL (ref 15–500)
Eosinophils Relative: 2.9 %
HCT: 40.6 % (ref 35.0–45.0)
Hemoglobin: 13.6 g/dL (ref 11.7–15.5)
Lymphs Abs: 3081 cells/uL (ref 850–3900)
MCH: 29 pg (ref 27.0–33.0)
MCHC: 33.5 g/dL (ref 32.0–36.0)
MCV: 86.6 fL (ref 80.0–100.0)
MPV: 10.4 fL (ref 7.5–12.5)
Monocytes Relative: 7 %
Neutro Abs: 2737 cells/uL (ref 1500–7800)
Neutrophils Relative %: 42.1 %
Platelets: 235 10*3/uL (ref 140–400)
RBC: 4.69 10*6/uL (ref 3.80–5.10)
RDW: 12.7 % (ref 11.0–15.0)
Total Lymphocyte: 47.4 %
WBC: 6.5 10*3/uL (ref 3.8–10.8)

## 2023-03-11 LAB — COMPLETE METABOLIC PANEL WITH GFR
AG Ratio: 1.8 (calc) (ref 1.0–2.5)
ALT: 22 U/L (ref 6–29)
AST: 21 U/L (ref 10–35)
Albumin: 4.6 g/dL (ref 3.6–5.1)
Alkaline phosphatase (APISO): 81 U/L (ref 37–153)
BUN: 11 mg/dL (ref 7–25)
CO2: 29 mmol/L (ref 20–32)
Calcium: 9.5 mg/dL (ref 8.6–10.4)
Chloride: 103 mmol/L (ref 98–110)
Creat: 0.93 mg/dL (ref 0.50–1.05)
Globulin: 2.5 g/dL (calc) (ref 1.9–3.7)
Glucose, Bld: 88 mg/dL (ref 65–99)
Potassium: 4.2 mmol/L (ref 3.5–5.3)
Sodium: 139 mmol/L (ref 135–146)
Total Bilirubin: 0.7 mg/dL (ref 0.2–1.2)
Total Protein: 7.1 g/dL (ref 6.1–8.1)
eGFR: 70 mL/min/{1.73_m2} (ref >=60)

## 2023-03-11 LAB — LIPID PANEL
Cholesterol: 210 mg/dL — ABNORMAL HIGH (ref <200)
HDL: 64 mg/dL (ref >=50)
LDL Cholesterol (Calc): 124 mg/dL (calc) — ABNORMAL HIGH
Non-HDL Cholesterol (Calc): 146 mg/dL (calc) — ABNORMAL HIGH (ref <130)
Total CHOL/HDL Ratio: 3.3 (calc) (ref <5.0)
Triglycerides: 116 mg/dL (ref <150)

## 2023-03-11 LAB — URINALYSIS, ROUTINE W REFLEX MICROSCOPIC
Bilirubin Urine: NEGATIVE
Glucose, UA: NEGATIVE
Hgb urine dipstick: NEGATIVE
Ketones, ur: NEGATIVE
Leukocytes,Ua: NEGATIVE
Nitrite: NEGATIVE
Protein, ur: NEGATIVE
Specific Gravity, Urine: 1.006 (ref 1.001–1.035)
pH: 6.5 (ref 5.0–8.0)

## 2023-03-11 LAB — VITAMIN B12: Vitamin B-12: 1229 pg/mL — ABNORMAL HIGH (ref 200–1100)

## 2023-03-11 LAB — VITAMIN D 25 HYDROXY (VIT D DEFICIENCY, FRACTURES): Vit D, 25-Hydroxy: 72 ng/mL (ref 30–100)

## 2023-03-11 LAB — MICROALBUMIN / CREATININE URINE RATIO
Creatinine, Urine: 33 mg/dL (ref 20–275)
Microalb Creat Ratio: 6 mg/g creat (ref <30)
Microalb, Ur: 0.2 mg/dL

## 2023-03-11 LAB — HEMOGLOBIN A1C
Hgb A1c MFr Bld: 5.7 % of total Hgb — ABNORMAL HIGH (ref <5.7)
Mean Plasma Glucose: 117 mg/dL
eAG (mmol/L): 6.5 mmol/L

## 2023-03-11 LAB — MAGNESIUM: Magnesium: 2.1 mg/dL (ref 1.5–2.5)

## 2023-03-11 LAB — INSULIN, RANDOM: Insulin: 6.4 u[IU]/mL

## 2023-03-11 LAB — TSH: TSH: 0.01 mIU/L — ABNORMAL LOW (ref 0.40–4.50)

## 2023-04-03 LAB — COLOGUARD: COLOGUARD: NEGATIVE

## 2023-05-30 ENCOUNTER — Other Ambulatory Visit: Payer: Self-pay | Admitting: Nurse Practitioner

## 2023-05-30 DIAGNOSIS — E039 Hypothyroidism, unspecified: Secondary | ICD-10-CM

## 2023-11-05 ENCOUNTER — Encounter: Payer: Self-pay | Admitting: Obstetrics and Gynecology

## 2024-03-09 ENCOUNTER — Encounter: Payer: No Typology Code available for payment source | Admitting: Nurse Practitioner

## 2024-04-29 ENCOUNTER — Ambulatory Visit: Admitting: Plastic Surgery

## 2024-04-29 VITALS — BP 124/65 | HR 73 | Ht 68.0 in | Wt 168.8 lb

## 2024-04-29 DIAGNOSIS — R222 Localized swelling, mass and lump, trunk: Secondary | ICD-10-CM

## 2024-04-29 DIAGNOSIS — D489 Neoplasm of uncertain behavior, unspecified: Secondary | ICD-10-CM

## 2024-04-29 NOTE — Progress Notes (Signed)
 Ms Iseminger returns today for re-evaluation of her left para-spinous lipoma.She was originally seen in Dec of 23 but after her last consult she had resolution of her pain for approximately 6 months. It has recently returned and she was counseled by her PCP that the mass should be removed.  On exam the lipoma is palpable as before. It is soft and mobile.  Will plan to excise it the OR. Will send the tissue to pathology. All questions answered. Will proceed at her request

## 2024-05-11 ENCOUNTER — Telehealth: Payer: Self-pay | Admitting: Plastic Surgery

## 2024-05-11 NOTE — Telephone Encounter (Signed)
 Patient returned Melissa's call to schedule surgery

## 2024-05-26 ENCOUNTER — Ambulatory Visit (INDEPENDENT_AMBULATORY_CARE_PROVIDER_SITE_OTHER): Admitting: Plastic Surgery

## 2024-05-26 ENCOUNTER — Encounter: Payer: Self-pay | Admitting: Plastic Surgery

## 2024-05-26 VITALS — BP 134/79 | HR 65 | Ht 68.0 in | Wt 169.2 lb

## 2024-05-26 DIAGNOSIS — D489 Neoplasm of uncertain behavior, unspecified: Secondary | ICD-10-CM

## 2024-05-26 NOTE — H&P (View-Only) (Signed)
 Patient ID: Jodi Graham, female    DOB: [birth year], 62 y.o.   MRN: [id number]  Chief Complaint  Patient presents with   Pre-op Exam    No diagnosis found.   History of Present Illness: Jodi Graham is a 63 y.o.  female  with a history of a paraspinous mass.  She presents for preoperative evaluation for upcoming procedure, excision of paraspinous mass, scheduled for October 26 with Dr. Waddell.  The patient has not had problems with anesthesia.   Summary of Previous Visit:  Jodi Graham returns today for re-evaluation of her left para-spinous lipoma.She was originally seen in Dec of 23 but after her last consult she had resolution of her pain for approximately 6 months. It has recently returned and she was counseled by her PCP that the mass should be removed.   On exam the lipoma is palpable as before. It is soft and mobile.   Will plan to excise it the OR. Will send the tissue to pathology. All questions answered. Will proceed at her request      Job: Accountant  PMH Significant for: Hypothyroidism   Past Medical History: Allergies: Allergies  Allergen Reactions   Codeine Nausea And Vomiting    Current Medications:  Current Outpatient Medications:    cholecalciferol (VITAMIN D ) 1000 UNITS tablet, Take 1,000 Units by mouth daily., Disp: , Rfl:    Cyanocobalamin (B-12 PO), Take by mouth., Disp: , Rfl:    levothyroxine  (SYNTHROID ) 125 MCG tablet, TAKE ONE TABLET BY MOUTH DAILY ON AN EMPTY STOMACH WITH ONLY WATER FOR 30 MINUTES AND NO ANTACID MEDICATIONS, CALCIUM, OR MAGNESIUM FOR 4 HOURS; AVOID BIOTIN, Disp: 90 tablet, Rfl: 1   Multiple Vitamin (MULTIVITAMIN) tablet, Take 1 tablet by mouth daily. 2-3 times a week, Disp: , Rfl:   Past Medical Problems: Past Medical History:  Diagnosis Date   DDD (degenerative disc disease), lumbar    back and neck  issues    Hypothyroidism     Past Surgical History: Past Surgical History:  Procedure Laterality Date   BIOPSY  THYROID   2009   Negative   WISDOM TOOTH EXTRACTION      Social History: Social History   Socioeconomic History   Marital status: Married    Spouse name: Not on file   Number of children: Not on file   Years of education: Not on file   Highest education level: Not on file  Occupational History   Not on file  Tobacco Use   Smoking status: Former   Smokeless tobacco: Never   Tobacco comments:    > 14 yrs ago   Vaping Use   Vaping status: Never Used  Substance and Sexual Activity   Alcohol use: Yes    Alcohol/week: 2.0 standard drinks of alcohol    Types: 2 Standard drinks or equivalent per week   Drug use: No   Sexual activity: Yes    Partners: Male    Birth control/protection: Condom  Other Topics Concern   Not on file  Social History Narrative   Not on file   Social Drivers of Health   Financial Resource Strain: Not on file  Food Insecurity: Not on file  Transportation Needs: Not on file  Physical Activity: Not on file  Stress: Not on file  Social Connections: Not on file  Intimate Partner Violence: Not on file    Family History: Family History  Problem Relation Age of Onset   Dementia Mother  from TBI with car wreck   Lymphoma Father 78       mass lower abdominal region but not stomach cancer    Lymphoma Paternal Uncle 77   Cancer Paternal Uncle 11       leukemia   Colon cancer Neg Hx    Colon polyps Neg Hx    Esophageal cancer Neg Hx    Rectal cancer Neg Hx    Stomach cancer Neg Hx     Review of Systems: ROS  Physical Exam: Vital Signs BP 134/79 (BP Location: Left Arm, Patient Position: Sitting, Cuff Size: Normal)   Pulse 65   Ht 5' 8 (1.727 m)   Wt 169 lb 3.2 oz (76.7 kg)   SpO2 98%   BMI 25.73 kg/m   Physical Exam  Constitutional:      General: Not in acute distress.    Appearance: Normal appearance. Not ill-appearing.  HENT:     Head: Normocephalic and atraumatic.  Eyes:     Pupils: Pupils are equal, round Neck:      Musculoskeletal: Normal range of motion.  Cardiovascular:     Rate and Rhythm: Normal rate    Pulses: Normal pulses.  Pulmonary:     Effort: Pulmonary effort is normal. No respiratory distress.  Abdominal:     General: Abdomen is flat. There is no distension.  Musculoskeletal: Normal range of motion.  Skin:    General: Skin is warm and dry.     Findings: No erythema or rash.  Neurological:     General: No focal deficit present.     Mental Status: Alert and oriented to person, place, and time. Mental status is at baseline.     Motor: No weakness.  Psychiatric:        Mood and Affect: Mood normal.        Behavior: Behavior normal.  Back: 2 cm palpable mass in the paraspinous muscles to the left of the spine.  MRI results reviewed, consistent with a 2 cm intramuscular paraspinous lipoma   Assessment/Plan: The patient is scheduled for left paraspinous mass excision with Dr. Waddell.  Risks, benefits, and alternatives of procedure discussed, questions answered and consent obtained.    Smoking Status: Non-smoker; Last Mammogram: February 2025; Results: BI-RADS 1  Caprini Score: 3; Risk Factors include: Age, BMI equal to 25, and length of planned surgery. Recommendation for mechanical  prophylaxis. Encourage early ambulation.   Pictures obtained: @consult  no  Post-op Rx sent to pharmacy: Oxycodone, Zofran  Patient was provided with the standard General Surgical Risk consent document and Pain Medication Agreement prior to their appointment.  They had adequate time to read through the risk consent documents and Pain Medication Agreement. We also discussed them in person together during this preop appointment.  Specifically we discussed the risks of bleeding, infection, and need for additional procedures based on pathologic results.  We also discussed the possibility of damage to surrounding structures.  She does have a trip scheduled approximately 3 weeks after surgery.  We discussed lower  extremity exercises while on the plane and the importance of hydration and alcohol avoidance all of their questions were answered to their satisfaction.  Recommended calling if they have any further questions.  Risk consent form and Pain Medication Agreement to be scanned into patient's chart.  Plan: Excision of paraspinous mass.  All questions answered to her satisfaction and we will proceed at her request.  Patient will need to be prone for the procedure.  45 minutes  spent discussing procedure and doing preoperative exam, documenting and coordinating care.   Electronically signed by: [name], MD 05/26/2024 8:39 AM

## 2024-05-26 NOTE — Progress Notes (Signed)
 Patient ID: Jodi Graham, female    DOB: [birth year], 63 y.o.   MRN: [id number]  Chief Complaint  Patient presents with   Pre-op Exam    No diagnosis found.   History of Present Illness: Jodi Graham is a 63 y.o.  female  with a history of a paraspinous mass.  She presents for preoperative evaluation for upcoming procedure, excision of paraspinous mass, scheduled for October 26 with Dr. Waddell.  The patient has not had problems with anesthesia.   Summary of Previous Visit:  Jodi Graham returns today for re-evaluation of her left para-spinous lipoma.She was originally seen in Dec of 23 but after her last consult she had resolution of her pain for approximately 6 months. It has recently returned and she was counseled by her PCP that the mass should be removed.   On exam the lipoma is palpable as before. It is soft and mobile.   Will plan to excise it the OR. Will send the tissue to pathology. All questions answered. Will proceed at her request      Job: Accountant  PMH Significant for: Hypothyroidism   Past Medical History: Allergies: Allergies  Allergen Reactions   Codeine Nausea And Vomiting    Current Medications:  Current Outpatient Medications:    cholecalciferol (VITAMIN D ) 1000 UNITS tablet, Take 1,000 Units by mouth daily., Disp: , Rfl:    Cyanocobalamin (B-12 PO), Take by mouth., Disp: , Rfl:    levothyroxine  (SYNTHROID ) 125 MCG tablet, TAKE ONE TABLET BY MOUTH DAILY ON AN EMPTY STOMACH WITH ONLY WATER FOR 30 MINUTES AND NO ANTACID MEDICATIONS, CALCIUM, OR MAGNESIUM FOR 4 HOURS; AVOID BIOTIN, Disp: 90 tablet, Rfl: 1   Multiple Vitamin (MULTIVITAMIN) tablet, Take 1 tablet by mouth daily. 2-3 times a week, Disp: , Rfl:   Past Medical Problems: Past Medical History:  Diagnosis Date   DDD (degenerative disc disease), lumbar    back and neck  issues    Hypothyroidism     Past Surgical History: Past Surgical History:  Procedure Laterality Date   BIOPSY  THYROID   2009   Negative   WISDOM TOOTH EXTRACTION      Social History: Social History   Socioeconomic History   Marital status: Married    Spouse name: Not on file   Number of children: Not on file   Years of education: Not on file   Highest education level: Not on file  Occupational History   Not on file  Tobacco Use   Smoking status: Former   Smokeless tobacco: Never   Tobacco comments:    > 14 yrs ago   Vaping Use   Vaping status: Never Used  Substance and Sexual Activity   Alcohol use: Yes    Alcohol/week: 2.0 standard drinks of alcohol    Types: 2 Standard drinks or equivalent per week   Drug use: No   Sexual activity: Yes    Partners: Male    Birth control/protection: Condom  Other Topics Concern   Not on file  Social History Narrative   Not on file   Social Drivers of Health   Financial Resource Strain: Not on file  Food Insecurity: Not on file  Transportation Needs: Not on file  Physical Activity: Not on file  Stress: Not on file  Social Connections: Not on file  Intimate Partner Violence: Not on file    Family History: Family History  Problem Relation Age of Onset   Dementia Mother  from TBI with car wreck   Lymphoma Father 78       mass lower abdominal region but not stomach cancer    Lymphoma Paternal Uncle 77   Cancer Paternal Uncle 11       leukemia   Colon cancer Neg Hx    Colon polyps Neg Hx    Esophageal cancer Neg Hx    Rectal cancer Neg Hx    Stomach cancer Neg Hx     Review of Systems: ROS  Physical Exam: Vital Signs BP 134/79 (BP Location: Left Arm, Patient Position: Sitting, Cuff Size: Normal)   Pulse 65   Ht 5' 8 (1.727 m)   Wt 169 lb 3.2 oz (76.7 kg)   SpO2 98%   BMI 25.73 kg/m   Physical Exam  Constitutional:      General: Not in acute distress.    Appearance: Normal appearance. Not ill-appearing.  HENT:     Head: Normocephalic and atraumatic.  Eyes:     Pupils: Pupils are equal, round Neck:      Musculoskeletal: Normal range of motion.  Cardiovascular:     Rate and Rhythm: Normal rate    Pulses: Normal pulses.  Pulmonary:     Effort: Pulmonary effort is normal. No respiratory distress.  Abdominal:     General: Abdomen is flat. There is no distension.  Musculoskeletal: Normal range of motion.  Skin:    General: Skin is warm and dry.     Findings: No erythema or rash.  Neurological:     General: No focal deficit present.     Mental Status: Alert and oriented to person, place, and time. Mental status is at baseline.     Motor: No weakness.  Psychiatric:        Mood and Affect: Mood normal.        Behavior: Behavior normal.  Back: 2 cm palpable mass in the paraspinous muscles to the left of the spine.  MRI results reviewed, consistent with a 2 cm intramuscular paraspinous lipoma   Assessment/Plan: The patient is scheduled for left paraspinous mass excision with Dr. Waddell.  Risks, benefits, and alternatives of procedure discussed, questions answered and consent obtained.    Smoking Status: Non-smoker; Last Mammogram: February 2025; Results: BI-RADS 1  Caprini Score: 3; Risk Factors include: Age, BMI equal to 25, and length of planned surgery. Recommendation for mechanical  prophylaxis. Encourage early ambulation.   Pictures obtained: @consult  no  Post-op Rx sent to pharmacy: Oxycodone, Zofran  Patient was provided with the standard General Surgical Risk consent document and Pain Medication Agreement prior to their appointment.  They had adequate time to read through the risk consent documents and Pain Medication Agreement. We also discussed them in person together during this preop appointment.  Specifically we discussed the risks of bleeding, infection, and need for additional procedures based on pathologic results.  We also discussed the possibility of damage to surrounding structures.  She does have a trip scheduled approximately 3 weeks after surgery.  We discussed lower  extremity exercises while on the plane and the importance of hydration and alcohol avoidance all of their questions were answered to their satisfaction.  Recommended calling if they have any further questions.  Risk consent form and Pain Medication Agreement to be scanned into patient's chart.  Plan: Excision of paraspinous mass.  All questions answered to her satisfaction and we will proceed at her request.  Patient will need to be prone for the procedure.  45 minutes  spent discussing procedure and doing preoperative exam, documenting and coordinating care.   Electronically signed by: [name], MD 05/26/2024 8:39 AM

## 2024-05-28 ENCOUNTER — Encounter (HOSPITAL_BASED_OUTPATIENT_CLINIC_OR_DEPARTMENT_OTHER): Payer: Self-pay | Admitting: Plastic Surgery

## 2024-05-28 ENCOUNTER — Other Ambulatory Visit: Payer: Self-pay

## 2024-06-04 ENCOUNTER — Ambulatory Visit (HOSPITAL_BASED_OUTPATIENT_CLINIC_OR_DEPARTMENT_OTHER): Admitting: Anesthesiology

## 2024-06-04 ENCOUNTER — Encounter (HOSPITAL_BASED_OUTPATIENT_CLINIC_OR_DEPARTMENT_OTHER)
Admission: RE | Disposition: A | Discharge status: Home / Self Care | Payer: Self-pay | Source: Home / Self Care | Attending: Plastic Surgery

## 2024-06-04 ENCOUNTER — Ambulatory Visit (HOSPITAL_BASED_OUTPATIENT_CLINIC_OR_DEPARTMENT_OTHER)
Admission: RE | Admit: 2024-06-04 | Discharge: 2024-06-04 | Disposition: A | Discharge status: Home / Self Care | Attending: Plastic Surgery | Admitting: Plastic Surgery

## 2024-06-04 ENCOUNTER — Other Ambulatory Visit: Payer: Self-pay

## 2024-06-04 ENCOUNTER — Encounter (HOSPITAL_BASED_OUTPATIENT_CLINIC_OR_DEPARTMENT_OTHER): Payer: Self-pay | Admitting: Plastic Surgery

## 2024-06-04 DIAGNOSIS — D171 Benign lipomatous neoplasm of skin and subcutaneous tissue of trunk: Secondary | ICD-10-CM

## 2024-06-04 DIAGNOSIS — D489 Neoplasm of uncertain behavior, unspecified: Secondary | ICD-10-CM | POA: Diagnosis present

## 2024-06-04 DIAGNOSIS — R222 Localized swelling, mass and lump, trunk: Secondary | ICD-10-CM | POA: Diagnosis not present

## 2024-06-04 DIAGNOSIS — Z87891 Personal history of nicotine dependence: Secondary | ICD-10-CM | POA: Insufficient documentation

## 2024-06-04 DIAGNOSIS — L988 Other specified disorders of the skin and subcutaneous tissue: Secondary | ICD-10-CM | POA: Diagnosis not present

## 2024-06-04 DIAGNOSIS — E039 Hypothyroidism, unspecified: Secondary | ICD-10-CM | POA: Insufficient documentation

## 2024-06-04 DIAGNOSIS — Z01818 Encounter for other preprocedural examination: Secondary | ICD-10-CM

## 2024-06-04 HISTORY — PX: EXCISION MASS ABDOMINAL: SHX6701

## 2024-06-04 SURGERY — EXCISION, MASS, TORSO
Anesthesia: General | Site: Back

## 2024-06-04 MED ORDER — MIDAZOLAM HCL 2 MG/2ML IJ SOLN
INTRAMUSCULAR | Status: AC
  Filled 2024-06-04: qty 2

## 2024-06-04 MED ORDER — BUPIVACAINE-EPINEPHRINE 0.25% -1:200000 IJ SOLN
INTRAMUSCULAR | Status: DC | PRN
  Administered 2024-06-04: 10 mL

## 2024-06-04 MED ORDER — SUGAMMADEX SODIUM 200 MG/2ML IV SOLN
INTRAVENOUS | Status: DC | PRN
  Administered 2024-06-04: 200 mg via INTRAVENOUS

## 2024-06-04 MED ORDER — PROPOFOL 10 MG/ML IV BOLUS
INTRAVENOUS | Status: AC
Start: 2024-06-04 — End: 2024-06-04
  Filled 2024-06-04: qty 20

## 2024-06-04 MED ORDER — ONDANSETRON HCL 4 MG/2ML IJ SOLN
INTRAMUSCULAR | Status: DC | PRN
  Administered 2024-06-04: 4 mg via INTRAVENOUS

## 2024-06-04 MED ORDER — PROPOFOL 10 MG/ML IV BOLUS
INTRAVENOUS | Status: DC | PRN
  Administered 2024-06-04: 150 mg via INTRAVENOUS

## 2024-06-04 MED ORDER — EPHEDRINE SULFATE-NACL 50-0.9 MG/10ML-% IV SOSY
PREFILLED_SYRINGE | INTRAVENOUS | Status: DC | PRN
  Administered 2024-06-04: 10 mg via INTRAVENOUS

## 2024-06-04 MED ORDER — ROCURONIUM BROMIDE 100 MG/10ML IV SOLN
INTRAVENOUS | Status: DC | PRN
  Administered 2024-06-04: 50 mg via INTRAVENOUS

## 2024-06-04 MED ORDER — DEXAMETHASONE SODIUM PHOSPHATE 10 MG/ML IJ SOLN
INTRAMUSCULAR | Status: AC
  Filled 2024-06-04: qty 1

## 2024-06-04 MED ORDER — ACETAMINOPHEN 500 MG PO TABS: 1000.0000 mg | ORAL_TABLET | Freq: Once | ORAL | Status: DC

## 2024-06-04 MED ORDER — SCOPOLAMINE 1 MG/3DAYS TD PT72: 1.0000 | MEDICATED_PATCH | TRANSDERMAL | Status: DC

## 2024-06-04 MED ORDER — ONDANSETRON HCL 4 MG/2ML IJ SOLN
INTRAMUSCULAR | Status: AC
  Filled 2024-06-04: qty 2

## 2024-06-04 MED ORDER — FENTANYL CITRATE (PF) 100 MCG/2ML IJ SOLN: 25.0000 ug | INTRAMUSCULAR | Status: DC | PRN

## 2024-06-04 MED ORDER — LACTATED RINGERS IV SOLN
INTRAVENOUS | Status: DC
Start: 2024-06-04 — End: 2024-06-04

## 2024-06-04 MED ORDER — CHLORHEXIDINE GLUCONATE CLOTH 2 % EX PADS: 6.0000 | MEDICATED_PAD | Freq: Once | CUTANEOUS | Status: DC

## 2024-06-04 MED ORDER — LIDOCAINE HCL (CARDIAC) PF 100 MG/5ML IV SOSY
PREFILLED_SYRINGE | INTRAVENOUS | Status: DC | PRN
  Administered 2024-06-04: 50 mg via INTRAVENOUS

## 2024-06-04 MED ORDER — SUCCINYLCHOLINE CHLORIDE 200 MG/10ML IV SOSY
PREFILLED_SYRINGE | INTRAVENOUS | Status: AC
  Filled 2024-06-04: qty 10

## 2024-06-04 MED ORDER — FENTANYL CITRATE (PF) 100 MCG/2ML IJ SOLN
INTRAMUSCULAR | Status: DC | PRN
  Administered 2024-06-04: 50 ug via INTRAVENOUS

## 2024-06-04 MED ORDER — OXYCODONE HCL 5 MG PO TABS: 5.0000 mg | ORAL_TABLET | Freq: Once | ORAL | Status: DC | PRN

## 2024-06-04 MED ORDER — DROPERIDOL 2.5 MG/ML IJ SOLN: 0.6250 mg | Freq: Once | INTRAMUSCULAR | Status: DC | PRN

## 2024-06-04 MED ORDER — ACETAMINOPHEN 10 MG/ML IV SOLN: 1000.0000 mg | Freq: Once | INTRAVENOUS | Status: DC | PRN

## 2024-06-04 MED ORDER — MIDAZOLAM HCL 5 MG/5ML IJ SOLN
INTRAMUSCULAR | Status: DC | PRN
  Administered 2024-06-04: 2 mg via INTRAVENOUS

## 2024-06-04 MED ORDER — DEXAMETHASONE SODIUM PHOSPHATE 4 MG/ML IJ SOLN
INTRAMUSCULAR | Status: DC | PRN
  Administered 2024-06-04: 5 mg via INTRAVENOUS

## 2024-06-04 MED ORDER — OXYCODONE HCL 5 MG/5ML PO SOLN: 5.0000 mg | Freq: Once | ORAL | Status: DC | PRN

## 2024-06-04 MED ORDER — ACETAMINOPHEN 10 MG/ML IV SOLN
INTRAVENOUS | Status: DC | PRN
Start: 2024-06-04 — End: 2024-06-04
  Administered 2024-06-04: 1000 mg via INTRAVENOUS

## 2024-06-04 MED ORDER — FENTANYL CITRATE (PF) 100 MCG/2ML IJ SOLN
INTRAMUSCULAR | Status: AC
  Filled 2024-06-04: qty 2

## 2024-06-04 SURGICAL SUPPLY — 70 items
BAND RUBBER #18 3X1/16 STRL (MISCELLANEOUS) IMPLANT
BENZOIN TINCTURE PRP APPL 2/3 (GAUZE/BANDAGES/DRESSINGS) IMPLANT
BLADE CLIPPER SURG (BLADE) IMPLANT
BLADE SURG 15 STRL LF DISP TIS (BLADE) ×2 IMPLANT
CANISTER SUCT 1200ML W/VALVE (MISCELLANEOUS) IMPLANT
CHLORAPREP W/TINT 26 (MISCELLANEOUS) ×2 IMPLANT
COVER BACK TABLE 60X90IN (DRAPES) ×2 IMPLANT
COVER MAYO STAND STRL (DRAPES) ×2 IMPLANT
DERMABOND ADVANCED .7 DNX12 (GAUZE/BANDAGES/DRESSINGS) IMPLANT
DRAIN WOUND RND W/TROCAR (DRAIN) IMPLANT
DRAPE LAPAROTOMY 100X72 PEDS (DRAPES) IMPLANT
DRAPE U-SHAPE 76X120 STRL (DRAPES) IMPLANT
DRAPE UTILITY XL STRL (DRAPES) ×2 IMPLANT
DRESSING MEPILEX FLEX 4X4 (GAUZE/BANDAGES/DRESSINGS) IMPLANT
DRSG TELFA 3X8 NADH STRL (GAUZE/BANDAGES/DRESSINGS) IMPLANT
ELECT COATED BLADE 2.86 ST (ELECTRODE) IMPLANT
ELECT NDL BLADE 2-5/6 (NEEDLE) ×2 IMPLANT
ELECT NEEDLE BLADE 2-5/6 (NEEDLE) ×1 IMPLANT
ELECTRODE REM PT RETRN 9FT PED (ELECTROSURGICAL) IMPLANT
ELECTRODE REM PT RTRN 9FT ADLT (ELECTROSURGICAL) IMPLANT
EVACUATOR SILICONE 100CC (DRAIN) IMPLANT
GAUZE SPONGE 2X2 STRL 8-PLY (GAUZE/BANDAGES/DRESSINGS) IMPLANT
GAUZE SPONGE 4X4 12PLY STRL LF (GAUZE/BANDAGES/DRESSINGS) IMPLANT
GAUZE XEROFORM 1X8 LF (GAUZE/BANDAGES/DRESSINGS) IMPLANT
GLOVE BIO SURGEON STRL SZ 6.5 (GLOVE) IMPLANT
GLOVE BIO SURGEON STRL SZ7.5 (GLOVE) IMPLANT
GLOVE BIO SURGEON STRL SZ8 (GLOVE) ×2 IMPLANT
GLOVE BIOGEL PI IND STRL 7.0 (GLOVE) IMPLANT
GLOVE BIOGEL PI IND STRL 8 (GLOVE) IMPLANT
GOWN STRL REUS W/ TWL LRG LVL3 (GOWN DISPOSABLE) ×2 IMPLANT
GOWN STRL REUS W/ TWL XL LVL3 (GOWN DISPOSABLE) IMPLANT
GOWN STRL REUS W/TWL XL LVL3 (GOWN DISPOSABLE) ×2 IMPLANT
HIBICLENS CHG 4% 4OZ BTL (MISCELLANEOUS) ×2 IMPLANT
MARKER SKIN DUAL TIP RULER LAB (MISCELLANEOUS) IMPLANT
NDL FILTER BLUNT 18X1 1/2 (NEEDLE) IMPLANT
NDL HYPO 30GX1 BEV (NEEDLE) IMPLANT
NDL PRECISIONGLIDE 27X1.5 (NEEDLE) ×2 IMPLANT
NDL SAFETY ECLIPSE 18X1.5 (NEEDLE) IMPLANT
NEEDLE FILTER BLUNT 18X1 1/2 (NEEDLE) IMPLANT
NEEDLE HYPO 30GX1 BEV (NEEDLE) IMPLANT
NEEDLE PRECISIONGLIDE 27X1.5 (NEEDLE) ×1 IMPLANT
NS IRRIG 1000ML POUR BTL (IV SOLUTION) IMPLANT
PACK BASIN DAY SURGERY FS (CUSTOM PROCEDURE TRAY) ×2 IMPLANT
PACK UNIVERSAL I (CUSTOM PROCEDURE TRAY) IMPLANT
PENCIL SMOKE EVACUATOR (MISCELLANEOUS) ×2 IMPLANT
SHEET MEDIUM DRAPE 40X70 STRL (DRAPES) IMPLANT
SLEEVE SCD COMPRESS KNEE MED (STOCKING) IMPLANT
SPONGE T-LAP 18X18 ~~LOC~~+RFID (SPONGE) IMPLANT
STAPLER SKIN PROX WIDE 3.9 (STAPLE) ×2 IMPLANT
STRIP CLOSURE SKIN 1/2X4 (GAUZE/BANDAGES/DRESSINGS) IMPLANT
SUCTION TUBE FRAZIER 10FR DISP (SUCTIONS) IMPLANT
SUT ETHILON 4 0 PS 2 18 (SUTURE) IMPLANT
SUT MNCRL AB 3-0 PS2 27 (SUTURE) IMPLANT
SUT MNCRL AB 4-0 PS2 18 (SUTURE) IMPLANT
SUT MON AB 5-0 P3 18 (SUTURE) IMPLANT
SUT PDS II 3-0 CT2 27 ABS (SUTURE) IMPLANT
SUT PROLENE 5 0 P 3 (SUTURE) IMPLANT
SUT STRATA 3-0 60 PS-1 (SUTURE) IMPLANT
SUT VIC AB 3-0 SH 27X BRD (SUTURE) IMPLANT
SUT VIC AB 4-0 PS2 18 (SUTURE) IMPLANT
SUT VICRYL RAPIDE 4-0 (SUTURE) IMPLANT
SWAB COLLECTION DEVICE MRSA (MISCELLANEOUS) IMPLANT
SWAB CULTURE ESWAB REG 1ML (MISCELLANEOUS) IMPLANT
SYR 50ML LL SCALE MARK (SYRINGE) IMPLANT
SYR BULB EAR ULCER 3OZ GRN STR (SYRINGE) IMPLANT
SYR CONTROL 10ML LL (SYRINGE) ×2 IMPLANT
TOWEL GREEN STERILE FF (TOWEL DISPOSABLE) ×2 IMPLANT
TRAY DSU PREP LF (CUSTOM PROCEDURE TRAY) IMPLANT
TUBE CONNECTING 20X1/4 (TUBING) IMPLANT
YANKAUER SUCT BULB TIP NO VENT (SUCTIONS) IMPLANT

## 2024-06-04 NOTE — Anesthesia Preprocedure Evaluation (Addendum)
 Anesthesia Evaluation  Patient identified by MRN, date of birth, ID band Patient awake    Reviewed: Allergy & Precautions, NPO status , Patient's Chart, lab work & pertinent test results  Airway Mallampati: I  TM Distance: >3 FB Neck ROM: Full    Dental  (+) Teeth Intact, Dental Advisory Given   Pulmonary former smoker   breath sounds clear to auscultation       Cardiovascular negative cardio ROS  Rhythm:Regular Rate:Normal     Neuro/Psych negative neurological ROS  negative psych ROS   GI/Hepatic negative GI ROS, Neg liver ROS,,,  Endo/Other  Hypothyroidism    Renal/GU      Musculoskeletal negative musculoskeletal ROS (+)    Abdominal   Peds  Hematology   Anesthesia Other Findings   Reproductive/Obstetrics                              Anesthesia Physical Anesthesia Plan  ASA: 2  Anesthesia Plan: General   Post-op Pain Management: Tylenol  PO (pre-op)* and Toradol IV (intra-op)*   Induction: Intravenous  PONV Risk Score and Plan: 4 or greater and Ondansetron , Dexamethasone , Midazolam  and Scopolamine  patch - Pre-op  Airway Management Planned: Oral ETT  Additional Equipment: None  Intra-op Plan:   Post-operative Plan: Extubation in OR  Informed Consent: I have reviewed the patients History and Physical, chart, labs and discussed the procedure including the risks, benefits and alternatives for the proposed anesthesia with the patient or authorized representative who has indicated his/her understanding and acceptance.     Dental advisory given  Plan Discussed with: CRNA  Anesthesia Plan Comments:          Anesthesia Quick Evaluation

## 2024-06-04 NOTE — Op Note (Addendum)
 DATE OF OPERATION: 06/04/2024  LOCATION: [facility] operating Room  PREOPERATIVE DIAGNOSIS: Subcutaneous mass  POSTOPERATIVE DIAGNOSIS: Same  PROCEDURE: Excision of subcutaneous mass, left para spinous region  SURGEON: Marinell Birmingham, MD  ASSISTANT: Not applicable  EBL: 5 cc  CONDITION: Stable  COMPLICATIONS: None  INDICATION: The patient, Jodi Graham, is a 64 y.o. female born on 24-Sep-1960, is here for treatment of a mass the cause discomfort.  Felt to be a lipoma on MRI study.   PROCEDURE DETAILS:  The patient was seen prior to surgery and marked.  The IV antibiotics were given. The patient was taken to the operating room and given a general anesthetic. A standard time out was performed and all information was confirmed by those in the room. SCDs were placed.   The patient was placed in the prone position and the back was prepped and draped sterilely.  An incision was made over the palpable mass and the dissection carried out down through the dermis subcutaneous tissue and through the fascia.  The mass was located and excised.  It was sent to pathology for routine examination.  The wound was inspected for bleeding and hemostasis achieved with electrocautery.  The wound was then closed in layers with 3-0 Monocryl in the fascia 3-0 Monocryl in the dermis and a running 4-0 Monocryl subcuticular stitch.  The incision was sealed with Dermabond and a sterile dressing applied.  Patient was transferred to the supine position and extubated without difficulty.  She was transferred to the recovery room in good condition.  All instrument needle sponge counts were reported as correct and there were no complications appreciated. The patient was allowed to wake up and taken to recovery room in stable condition at the end of the case. The family was notified at the end of the case.

## 2024-06-04 NOTE — Anesthesia Postprocedure Evaluation (Signed)
 Anesthesia Post Note  Patient: Jodi Graham  Procedure(s) Performed: EXCISION, MASS, TORSO (Back)     Patient location during evaluation: PACU Anesthesia Type: General Level of consciousness: awake and alert Pain management: pain level controlled Vital Signs Assessment: post-procedure vital signs reviewed and stable Respiratory status: spontaneous breathing, nonlabored ventilation, respiratory function stable and patient connected to nasal cannula oxygen Cardiovascular status: blood pressure returned to baseline and stable Postop Assessment: no apparent nausea or vomiting Anesthetic complications: no   No notable events documented.  Last Vitals:  Vitals:   06/04/24 1615 06/04/24 1636  BP: 125/67 135/63  Pulse: 65 63  Resp: 12 18  Temp:  (!) 36.2 C  SpO2: 98% 100%    Last Pain:  Vitals:   06/04/24 1636  TempSrc:   PainSc: 0-No pain                 [name]

## 2024-06-04 NOTE — Discharge Instructions (Addendum)
  Post Anesthesia Home Care Instructions  Activity: Get plenty of rest for the remainder of the day. A responsible individual must stay with you for 24 hours following the procedure.  For the next 24 hours, DO NOT: -Drive a car -Advertising copywriter -Drink alcoholic beverages -Take any medication unless instructed by your physician -Make any legal decisions or sign important papers.  Meals: Start with liquid foods such as gelatin or soup. Progress to regular foods as tolerated. Avoid greasy, spicy, heavy foods. If nausea and/or vomiting occur, drink only clear liquids until the nausea and/or vomiting subsides. Call your physician if vomiting continues.  Special Instructions/Symptoms: Your throat may feel dry or sore from the anesthesia or the breathing tube placed in your throat during surgery. If this causes discomfort, gargle with warm salt water. The discomfort should disappear within 24 hours.  If you had a scopolamine  patch placed behind your ear for the management of post- operative nausea and/or vomiting:  1. The medication in the patch is effective for 72 hours, after which it should be removed.  Wrap patch in a tissue and discard in the trash. Wash hands thoroughly with soap and water. 2. You may remove the patch earlier than 72 hours if you experience unpleasant side effects which may include dry mouth, dizziness or visual disturbances. 3. Avoid touching the patch. Wash your hands with soap and water after contact with the patch.     No tylenol  until after 915pm  Please leave dressing in place for 24 hours May remove dressing in 24 hours and shower Please do not submerge incision in water and avoid heavy lifting greater than 20 pounds. Please use Tylenol  or Motrin for pain control Please call the office for any questions or concerns [phone]

## 2024-06-04 NOTE — Transfer of Care (Signed)
 Immediate Anesthesia Transfer of Care Note  Patient: Jodi Graham  Procedure(s) Performed: Procedure(s) (LRB): EXCISION, MASS, TORSO (N/A)  Patient Location: PACU  Anesthesia Type: General  Level of Consciousness: awake, oriented, sedated and patient cooperative  Airway & Oxygen Therapy: Patient Spontanous Breathing  Post-op Assessment: Report given to PACU RN and Post -op Vital signs reviewed and stable  Post vital signs: Reviewed and stable  Complications: No apparent anesthesia complications Last Vitals:  Vitals Value Taken Time  BP    Temp    Pulse    Resp    SpO2      Last Pain:  Vitals:   06/04/24 1256  TempSrc: Tympanic  PainSc: 1       Patients Stated Pain Goal: 7 (06/04/24 1256)  Complications: No notable events documented.

## 2024-06-04 NOTE — Interval H&P Note (Signed)
 History and Physical Interval Note: No change in exam or indication for surgery All questions answered Site marked with her concurrence Will proceed with excision of a left para spinous mass at her request  06/04/2024 2:14 PM  Jodi Graham  has presented today for surgery, with the diagnosis of d48.9.  The various methods of treatment have been discussed with the patient and family. After consideration of risks, benefits and other options for treatment, the patient has consented to  Procedure(s) with comments: EXCISION, MASS, TORSO (N/A) - excision of mass as a surgical intervention.  The patient's history has been reviewed, patient examined, no change in status, stable for surgery.  I have reviewed the patient's chart and labs.  Questions were answered to the patient's satisfaction.     [name]

## 2024-06-04 NOTE — Anesthesia Procedure Notes (Signed)
 Procedure Name: Intubation Date/Time: 06/04/2024 3:01 PM  Performed by: Emilio Rock BIRCH, CRNAPre-anesthesia Checklist: Patient identified, Emergency Drugs available, Suction available and Patient being monitored Patient Re-evaluated:Patient Re-evaluated prior to induction Oxygen Delivery Method: Circle system utilized Preoxygenation: Pre-oxygenation with 100% oxygen Induction Type: IV induction Ventilation: Mask ventilation without difficulty Laryngoscope Size: Mac and 3 Grade View: Grade I Tube type: Oral Tube size: 7.0 mm Number of attempts: 1 Airway Equipment and Method: Stylet and Oral airway Placement Confirmation: ETT inserted through vocal cords under direct vision, positive ETCO2 and breath sounds checked- equal and bilateral Secured at: 22 cm Tube secured with: Tape Dental Injury: Teeth and Oropharynx as per pre-operative assessment

## 2024-06-05 ENCOUNTER — Encounter (HOSPITAL_BASED_OUTPATIENT_CLINIC_OR_DEPARTMENT_OTHER): Payer: Self-pay | Admitting: Plastic Surgery

## 2024-06-08 LAB — SURGICAL PATHOLOGY

## 2024-06-10 ENCOUNTER — Ambulatory Visit (INDEPENDENT_AMBULATORY_CARE_PROVIDER_SITE_OTHER): Admitting: Plastic Surgery

## 2024-06-10 VITALS — BP 113/70 | HR 66

## 2024-06-10 DIAGNOSIS — D171 Benign lipomatous neoplasm of skin and subcutaneous tissue of trunk: Secondary | ICD-10-CM

## 2024-06-10 DIAGNOSIS — Z9889 Other specified postprocedural states: Secondary | ICD-10-CM

## 2024-06-10 NOTE — Progress Notes (Signed)
 Jodi Graham returns today approximately 5 days postop from her excision of mass from the left paraspinous region.  She has no complaints other than some very mild discomfort.  On examination the incision is clean dry and intact without erythema no evidence of fluid.  I reviewed her pathology with her.  Pathology was consistent with a lipoma.  We discussed scar massage and wound management.  She will return to see me on an as-needed basis.

## 2024-06-21 ENCOUNTER — Encounter: Admitting: Student

## 2024-06-23 ENCOUNTER — Ambulatory Visit: Admitting: Student

## 2024-06-23 DIAGNOSIS — D171 Benign lipomatous neoplasm of skin and subcutaneous tissue of trunk: Secondary | ICD-10-CM

## 2024-06-23 NOTE — Progress Notes (Signed)
 Patient is a 63 year old female who underwent excision of subcutaneous mass to the back with Dr. Waddell on 06/04/2024.  She is almost 3 weeks postop.  She presents to the clinic today for postoperative follow-up.  Per chart review, pathology showed mature adipose tissue consistent with lipoma.  Patient was last seen in the clinic on 06/10/2024.  At this visit, patient was doing well and had no complaints other than very mild discomfort.  On exam, the incision was clean dry and intact.  Plan was for her to follow-up on a as needed basis.  Today, patient reports she is overall doing well.  She states that she is still having some discomfort around the incision.  She states that it is a dull aching pain and that it is intermittent.  She states that the incision also bothers her if she is leaning back in a chair or putting pressure on it.  She states that she has been taking Tylenol  for the pain which has been helping.  She denies any fevers or chills.  She denies any swelling.  She denies any drainage.  Denies any other issues or concerns at this time.  Chaperone present on exam.  On exam, patient is sitting upright in no acute distress.  Incision to the back overall appears to be well-healed.  There is a suture noted at the most superior aspect of the incision.  This was cut and removed.  There was a little bit of surrounding irritation to the suture.  There is no swelling or fluid noted on exam.  No surrounding erythema.  There is a little bit of firmness underneath the incision consistent with scar tissue.  There are no signs of infection on exam.  Recommend that patient apply Vaseline to the incision daily and gently massage the scar.  Discussed with her that this may help with some of her discomfort.  Recommended she cover the area with a dressing such as a Mepilex border dressing.  She expressed understanding.  Recommended that she alternate Tylenol  and ibuprofen to see if this helps with her pain.   Discussed with her to continue to closely monitor her pain and if she has any worsening symptoms she should let us  know.  Patient expressed understanding.  Patient to follow back up in about 2 weeks.  I discussed with her that if all of her symptoms have resolved at that time, she may cancel her appointment.  She expressed understanding and was in agreement with this.  I instructed her to call if she has any questions or concerns about anything.

## 2024-07-07 ENCOUNTER — Telehealth: Payer: Self-pay | Admitting: Student

## 2024-07-07 NOTE — Telephone Encounter (Signed)
 pt called and lvmail to cancel post op and not rebook, this would be her 3rd post op after sx looks like. is this not needed, I called the pt but had to lvmail

## 2024-07-14 ENCOUNTER — Encounter: Admitting: Student
